# Patient Record
Sex: Female | Born: 1945 | Smoking: Never smoker
Health system: Southern US, Community
[De-identification: ages and names within clinical notes are randomized; demographics above are authoritative.]

## PROBLEM LIST (undated history)

## (undated) DIAGNOSIS — I639 Cerebral infarction, unspecified: Secondary | ICD-10-CM

## (undated) DIAGNOSIS — I10 Essential (primary) hypertension: Secondary | ICD-10-CM

## (undated) DIAGNOSIS — E785 Hyperlipidemia, unspecified: Secondary | ICD-10-CM

## (undated) HISTORY — PX: PARTIAL HYSTERECTOMY: SHX80

## (undated) HISTORY — DX: Cerebral infarction, unspecified: I63.9

## (undated) HISTORY — PX: WISDOM TOOTH EXTRACTION: SHX21

## (undated) HISTORY — DX: Essential (primary) hypertension: I10

## (undated) HISTORY — PX: TONSILLECTOMY: SUR1361

## (undated) HISTORY — DX: Hyperlipidemia, unspecified: E78.5

---

## 2015-12-03 ENCOUNTER — Encounter: Payer: Self-pay | Admitting: Hematology and Oncology

## 2015-12-03 ENCOUNTER — Telehealth: Payer: Self-pay | Admitting: Hematology and Oncology

## 2015-12-03 NOTE — Telephone Encounter (Signed)
Pt cld back to schedule appt with [facility] for 9/19 at 1pm. She originally planned to have her granddaughter bring her but due to her granddaughter's work schedule she was unable to have her bring to appt. She states that she will get transportation via cab services. Demographics have been verified. Letter mailed to the patient and faxed to the referring.

## 2015-12-20 ENCOUNTER — Telehealth: Payer: Self-pay | Admitting: *Deleted

## 2015-12-20 NOTE — Telephone Encounter (Signed)
Call received requesting [place] address for this new patient.  Address, valet parking and registration information provided with this call.

## 2015-12-24 ENCOUNTER — Encounter: Payer: Self-pay | Admitting: Hematology and Oncology

## 2015-12-24 ENCOUNTER — Ambulatory Visit (HOSPITAL_BASED_OUTPATIENT_CLINIC_OR_DEPARTMENT_OTHER): Payer: Medicare Other | Admitting: Hematology and Oncology

## 2015-12-24 DIAGNOSIS — D472 Monoclonal gammopathy: Secondary | ICD-10-CM | POA: Insufficient documentation

## 2015-12-24 NOTE — Progress Notes (Signed)
[facility] CONSULT NOTE  No care team member to display  CHIEF COMPLAINTS/PURPOSE OF CONSULTATION:  MGUS IgM lambda  HISTORY OF PRESENTING ILLNESS:  Amber Rich 70 y.o. female is here because of recent diagnosis of IgM lambda monoclonal protein. Patient is visiting from [place] and is evaluating whether she will live in [place]. Her family. She went on undergone evaluation with a primary care physician who referred her to orthopedics because of bilateral arthritis. Orthopedic surgeon obtain workup which showed positive for factor and serum protein for phrases was done which revealed elevation of monoclonal protein 0.2 g. On immunofixation came back as IgM lambda. She was referred was for further evaluation. She reports no neuropathy no bone pain.  MEDICAL HISTORY:  Anxiety, urinary incontinence, rheumatoid arthritis  SURGICAL HISTORY: C-section, hysterectomy, tonsillectomy  SOCIAL HISTORY: denies any tobacco. Drinks alcohol occasionally on holidays, denies recreational drug use, unemployed  FAMILY HISTORY: positive for arthritis, hypertension, diabetes and stroke  ALLERGIES:  has no allergies on file.  MEDICATIONS: buspirone, oxybutynin, naproxen  REVIEW OF SYSTEMS:   Constitutional: Denies fevers, chills or abnormal night sweats Eyes: Denies blurriness of vision, double vision or watery eyes Ears, nose, mouth, throat, and face: Denies mucositis or sore throat Respiratory: Denies cough, dyspnea or wheezes Cardiovascular: Denies palpitation, chest discomfort or lower extremity swelling Gastrointestinal:  Denies nausea, heartburn or change in bowel habits Skin: Denies abnormal skin rashes Lymphatics: Denies new lymphadenopathy or easy bruising Neurological:Denies numbness, tingling or new weaknesses Behavioral/Psych: Mood is stable, no new changes   All other systems were reviewed with the patient and are negative.  PHYSICAL EXAMINATION: ECOG  PERFORMANCE STATUS: 1 - Symptomatic but completely ambulatory  Vitals:   12/24/15 1230  BP: (!) 142/76  Pulse: 95  Resp: 19  Temp: 98.8 F (37.1 C)   Filed Weights   12/24/15 1230  Weight: 189 lb 11.2 oz (86 kg)    GENERAL:alert, no distress and comfortable SKIN: skin color, texture, turgor are normal, no rashes or significant lesions EYES: normal, conjunctiva are pink and non-injected, sclera clear OROPHARYNX:no exudate, no erythema and lips, buccal mucosa, and tongue normal  NECK: supple, thyroid normal size, non-tender, without nodularity LYMPH:  no palpable lymphadenopathy in the cervical, axillary or inguinal LUNGS: clear to auscultation and percussion with normal breathing effort HEART: regular rate & rhythm and no murmurs and no lower extremity edema ABDOMEN:abdomen soft, non-tender and normal bowel sounds Musculoskeletal:no cyanosis of digits and no clubbing  PSYCH: alert & oriented x 3 with fluent speech NEURO: no focal motor/sensory deficits  ASSESSMENT AND PLAN:  MGUS (monoclonal gammopathy of unknown significance) IgM MGUS - IgM MGUS accounts for approximately 15 percent of MGUS cases. It is considered separately from the non-IgM MGUS because it has the potential to progress to smoldering Waldenstrm macroglobulinemia and to symptomatic [place] macroglobulinemia and less often to lymphoma or AL amyloidosis. Infrequently, IgM MGUS can progress to IgM multiple myeloma.  Patient's blood work revealed IgM lambda: 0.2 g monoclonal protein August 2017 (workup was done by orthopedics as part of workup for rheumatoid arthritis)  No evidence of hypercalcemia or anemia or bone pain or renal dysfunction. Because of very low likelihood of myeloma, I did not think that patient needed bone survey.  Risk of transformation to Waldenstrm's macroglobulinemia or myeloma is about 1-2% per year. I recommended a follow-up in 1 year with recheck her blood work. Patient reports that  she may be going back to [facility]  where she is from. She may follow with the local hematologist near her home. However if she is still in [place] she will come back and see [place] in a year.   All questions were answered. The patient knows to call the clinic with any problems, questions or concerns.    Rulon Eisenmenger, MD 12/24/15

## 2015-12-24 NOTE — Assessment & Plan Note (Signed)
IgM MGUS - IgM MGUS accounts for approximately 15 percent of MGUS cases. It is considered separately from the non-IgM MGUS because it has the potential to progress to smoldering Walden[place]rm macroglobulinemia and to symptomatic Walden[place]rm macroglobulinemia and less often to lymphoma or AL amyloidosis. Infrequently, IgM MGUS can progress to IgM multiple myeloma.  Patient's blood work revealed IgM lambda: 0.2 g monoclonal protein August 2017 (workup was done by orthopedics as part of workup for rheumatoid arthritis)  No evidence of hypercalcemia or anemia or bone pain or renal dysfunction. Because of very low likelihood of myeloma, I did not think that patient needed bone survey.  Risk of transformation to Waldenstrm's macroglobulinemia or myeloma is about 1-2% per year. I recommended a follow-up in 1 year with recheck her blood work. Patient reports that she may be going back to [place] where she is from. She may follow with the local hematologist near her home. However if she is still in [place] she will come back and see [place] in a year.

## 2016-11-10 ENCOUNTER — Telehealth: Payer: Self-pay

## 2016-11-10 NOTE — Telephone Encounter (Signed)
Called and left a message with a new appt due to bmdc  Lanijah Warzecha 

## 2016-12-16 ENCOUNTER — Other Ambulatory Visit: Payer: Medicare Other

## 2016-12-22 ENCOUNTER — Ambulatory Visit: Payer: Medicare Other | Admitting: Hematology and Oncology

## 2016-12-22 NOTE — Assessment & Plan Note (Deleted)
IgM MGUS - IgM MGUS accounts for approximately 15 percent of MGUS cases. It is considered separately from the non-IgM MGUS because it has the potential to progress to smoldering Waldenstrm macroglobulinemia and to symptomatic Walden[place]rm macroglobulinemia and less often to lymphoma or AL amyloidosis. Infrequently, IgM MGUS can progress to IgM multiple myeloma.  Patient's blood work revealed IgM lambda: 0.2 g monoclonal protein August 2017 (workup was done by orthopedics as part of workup for rheumatoid arthritis)  No evidence of hypercalcemia or anemia or bone pain or renal dysfunction. Because of very low likelihood of myeloma, I did not think that patient needed bone survey.  Risk of transformation to Waldenstrm's macroglobulinemia or myeloma is about 1-2% per year. I recommended a follow-up in 1 year with recheck her blood work. Patient reports that she may be going back to [place] where she is from

## 2016-12-23 ENCOUNTER — Ambulatory Visit: Payer: Medicare Other | Admitting: Hematology and Oncology

## 2019-08-26 ENCOUNTER — Emergency Department (HOSPITAL_COMMUNITY)
Admission: EM | Admit: 2019-08-26 | Discharge: 2019-08-26 | Disposition: A | Discharge status: Home / Self Care | Payer: Medicare Other | Attending: Emergency Medicine | Admitting: Emergency Medicine

## 2019-08-26 ENCOUNTER — Emergency Department (HOSPITAL_COMMUNITY): Payer: Medicare Other

## 2019-08-26 ENCOUNTER — Encounter (HOSPITAL_COMMUNITY): Payer: Self-pay

## 2019-08-26 ENCOUNTER — Other Ambulatory Visit: Payer: Self-pay

## 2019-08-26 DIAGNOSIS — R300 Dysuria: Secondary | ICD-10-CM | POA: Diagnosis present

## 2019-08-26 DIAGNOSIS — N3090 Cystitis, unspecified without hematuria: Secondary | ICD-10-CM | POA: Insufficient documentation

## 2019-08-26 DIAGNOSIS — R42 Dizziness and giddiness: Secondary | ICD-10-CM | POA: Insufficient documentation

## 2019-08-26 LAB — BASIC METABOLIC PANEL
Anion gap: 13 (ref 5–15)
BUN: 19 mg/dL (ref 8–23)
CO2: 22 mmol/L (ref 22–32)
Calcium: 9.3 mg/dL (ref 8.9–10.3)
Chloride: 107 mmol/L (ref 98–111)
Creatinine, Ser: 0.88 mg/dL (ref 0.44–1.00)
GFR calc Af Amer: >60 mL/min (ref >60)
GFR calc non Af Amer: >60 mL/min (ref >60)
Glucose, Bld: 106 mg/dL — ABNORMAL HIGH (ref 70–99)
Potassium: 4.4 mmol/L (ref 3.5–5.1)
Sodium: 142 mmol/L (ref 135–145)

## 2019-08-26 LAB — URINALYSIS, ROUTINE W REFLEX MICROSCOPIC
Bilirubin Urine: NEGATIVE
Glucose, UA: NEGATIVE mg/dL
Hgb urine dipstick: NEGATIVE
Ketones, ur: NEGATIVE mg/dL
Nitrite: NEGATIVE
Protein, ur: NEGATIVE mg/dL
Specific Gravity, Urine: 1.017 (ref 1.005–1.030)
pH: 5 (ref 5.0–8.0)

## 2019-08-26 LAB — CBC WITH DIFFERENTIAL/PLATELET
Abs Immature Granulocytes: 0.03 10*3/uL (ref 0.00–0.07)
Basophils Absolute: 0 10*3/uL (ref 0.0–0.1)
Basophils Relative: 0 %
Eosinophils Absolute: 0.1 10*3/uL (ref 0.0–0.5)
Eosinophils Relative: 2 %
HCT: 42.3 % (ref 36.0–46.0)
Hemoglobin: 13.7 g/dL (ref 12.0–15.0)
Immature Granulocytes: 0 %
Lymphocytes Relative: 17 %
Lymphs Abs: 1.3 10*3/uL (ref 0.7–4.0)
MCH: 29.7 pg (ref 26.0–34.0)
MCHC: 32.4 g/dL (ref 30.0–36.0)
MCV: 91.8 fL (ref 80.0–100.0)
Monocytes Absolute: 0.6 10*3/uL (ref 0.1–1.0)
Monocytes Relative: 8 %
Neutro Abs: 5.4 10*3/uL (ref 1.7–7.7)
Neutrophils Relative %: 73 %
Platelets: 178 10*3/uL (ref 150–400)
RBC: 4.61 MIL/uL (ref 3.87–5.11)
RDW: 11.9 % (ref 11.5–15.5)
WBC: 7.5 10*3/uL (ref 4.0–10.5)
nRBC: 0 % (ref 0.0–0.2)

## 2019-08-26 LAB — HEPATIC FUNCTION PANEL
ALT: 20 U/L (ref 0–44)
AST: 19 U/L (ref 15–41)
Albumin: 4.2 g/dL (ref 3.5–5.0)
Alkaline Phosphatase: 67 U/L (ref 38–126)
Bilirubin, Direct: 0.1 mg/dL (ref 0.0–0.2)
Indirect Bilirubin: 0.9 mg/dL (ref 0.3–0.9)
Total Bilirubin: 1 mg/dL (ref 0.3–1.2)
Total Protein: 7.1 g/dL (ref 6.5–8.1)

## 2019-08-26 LAB — LIPASE, BLOOD: Lipase: 32 U/L (ref 11–51)

## 2019-08-26 MED ORDER — CEPHALEXIN 500 MG PO CAPS
500.0000 mg | ORAL_CAPSULE | Freq: Two times a day (BID) | ORAL | 0 refills | Status: AC
Start: 2019-08-26 — End: 2019-08-31

## 2019-08-26 NOTE — ED Triage Notes (Signed)
Pt c/o dizziness and possible UTI; pt endorses urinary frequency, dysuria, and decreased urine output; pt also c/o bilateral ankle swelling; denies CP, but states she sometimes feels her heart fluttering; occasional SOB; denies N/V

## 2019-08-26 NOTE — ED Provider Notes (Signed)
[facility] EMERGENCY DEPARTMENT Provider Note   CSN: [id number] Arrival date & time: 08/26/19  1506     History Chief Complaint  Patient presents with  . Dizziness  . Dysuria    Amber Rich is a 74 y.o. female.  Patient here with multiple issues.  Has had some intermittent dizziness for several weeks.  None currently at rest.  Sometimes she feels difficulty with ambulation.  Denies any extremity pain.  No headaches.  No vision changes.  Does notice a buzzing in her left ear at times.  Patient also concern for urinary tract infection as she does have dysuria.  She is also had some chronic right-sided rib pain that comes and goes over time as well.  She denies any numbness, weakness, speech changes.  No history of kidney stones.  The history is provided by the patient.  Illness Severity:  Mild Onset quality:  Gradual Timing:  Intermittent Progression:  Waxing and waning Chronicity:  New Associated symptoms: ear pain (buzzing)   Associated symptoms: no abdominal pain, no chest pain, no cough, no fever, no headaches, no loss of consciousness, no rash, no shortness of breath, no sore throat and no vomiting        History reviewed. No pertinent past medical history.  Patient Active Problem List   Diagnosis Date Noted  . MGUS (monoclonal gammopathy of unknown significance) 12/24/2015    History reviewed. No pertinent surgical history.   OB History   No obstetric history on file.     History reviewed. No pertinent family history.  Social History   Tobacco Use  . Smoking status: Never Smoker  . Smokeless tobacco: Never Used  Substance Use Topics  . Alcohol use: No  . Drug use: No    Home Medications Prior to Admission medications   Not on File    Allergies    Patient has no allergy information on record.  Review of Systems   Review of Systems  Constitutional: Negative for chills and fever.  HENT: Positive for ear pain (buzzing).  Negative for sore throat.   Eyes: Negative for pain and visual disturbance.  Respiratory: Negative for cough and shortness of breath.   Cardiovascular: Negative for chest pain and palpitations.  Gastrointestinal: Negative for abdominal pain and vomiting.  Genitourinary: Positive for dysuria. Negative for hematuria.  Musculoskeletal: Negative for arthralgias and back pain.  Skin: Negative for color change and rash.  Neurological: Positive for dizziness and light-headedness. Negative for tremors, seizures, loss of consciousness, syncope, facial asymmetry, speech difficulty, weakness, numbness and headaches.  All other systems reviewed and are negative.   Physical Exam Updated Vital Signs  ED Triage Vitals  Enc Vitals Group     BP 08/26/19 1510 (!) 162/80     Pulse Rate 08/26/19 1510 79     Resp 08/26/19 1510 17     Temp 08/26/19 1510 98.6 F (37 C)     Temp Source 08/26/19 1510 Oral     SpO2 08/26/19 1510 100 %     Weight 08/26/19 1510 200 lb (90.7 kg)     Height 08/26/19 1510 5' 9.5" (1.765 m)     Head Circumference --      Peak Flow --      Pain Score 08/26/19 1532 0     Pain Loc --      Pain Edu? --      Excl. in [place]? --     Physical Exam Vitals and nursing note reviewed.  Constitutional:      General: She is not in acute distress.    Appearance: She is well-developed.  HENT:     Head: Normocephalic and atraumatic.     Right Ear: Tympanic membrane normal.     Ears:     Comments: Earwax in left ear canal    Nose: Nose normal.     Mouth/Throat:     Mouth: Mucous membranes are moist.  Eyes:     Extraocular Movements: Extraocular movements intact.     Conjunctiva/sclera: Conjunctivae normal.     Pupils: Pupils are equal, round, and reactive to light.  Cardiovascular:     Rate and Rhythm: Normal rate and regular rhythm.     Pulses: Normal pulses.     Heart sounds: Normal heart sounds. No murmur.  Pulmonary:     Effort: Pulmonary effort is normal. No respiratory  distress.     Breath sounds: Normal breath sounds.  Abdominal:     Palpations: Abdomen is soft.     Tenderness: There is no abdominal tenderness.  Musculoskeletal:        General: No tenderness. Normal range of motion.     Cervical back: Normal range of motion and neck supple.  Skin:    General: Skin is warm and dry.     Capillary Refill: Capillary refill takes less than 2 seconds.  Neurological:     General: No focal deficit present.     Mental Status: She is alert and oriented to person, place, and time.     Cranial Nerves: No cranial nerve deficit.     Sensory: No sensory deficit.     Motor: No weakness.     Coordination: Coordination normal.     Gait: Gait normal.     Comments: 5+ out of 5 strength throughout, normal sensation, no drift, normal finger-to-nose finger, normal speech     ED Results / Procedures / Treatments   Labs (all labs ordered are listed, but only abnormal results are displayed) Labs Reviewed  BASIC METABOLIC PANEL - Abnormal; Notable for the following components:      Result Value   Glucose, Bld 106 (*)    All other components within normal limits  URINALYSIS, ROUTINE W REFLEX MICROSCOPIC - Abnormal; Notable for the following components:   Leukocytes,Ua TRACE (*)    Bacteria, UA RARE (*)    All other components within normal limits  URINE CULTURE  CBC WITH DIFFERENTIAL/PLATELET  HEPATIC FUNCTION PANEL  LIPASE, BLOOD    EKG EKG Interpretation  Date/Time:  Saturday Aug 26 2019 15:41:29 EDT Ventricular Rate:  80 PR Interval:    QRS Duration: 108 QT Interval:  394 QTC Calculation: 455 R Axis:   1 Text Interpretation: Sinus rhythm Low voltage, precordial leads Abnormal R-wave progression, early transition Minimal ST depression, lateral leads Confirmed by Lennice Sites [phone]) on 08/26/2019 3:55:27 PM   Radiology DG Chest 2 View  Result Date: 08/26/2019 CLINICAL DATA:  Right-sided rib pain. EXAM: CHEST - 2 VIEW COMPARISON:  None. FINDINGS:  The heart size and mediastinal contours are within normal limits. Both lungs are clear. The visualized skeletal structures are unremarkable. IMPRESSION: No active cardiopulmonary disease. Electronically Signed   By: Virgina Norfolk M.D.   On: 08/26/2019 16:06   CT Head Wo Contrast  Result Date: 08/26/2019 CLINICAL DATA:  Dizziness. EXAM: CT HEAD WITHOUT CONTRAST TECHNIQUE: Contiguous axial images were obtained from the base of the skull through the vertex without intravenous contrast. COMPARISON:  None. FINDINGS:  Brain: No evidence of acute infarction, hemorrhage, hydrocephalus, extra-axial collection or mass lesion/mass effect. Vascular: No hyperdense vessel or unexpected calcification. Skull: Normal. Negative for fracture or focal lesion. Sinuses/Orbits: No acute finding. Other: None. IMPRESSION: No acute intracranial pathology. Electronically Signed   By: Virgina Norfolk M.D.   On: 08/26/2019 16:11    Procedures Procedures (including critical care time)  Medications Ordered in ED Medications - No data to display  ED Course  I have reviewed the triage vital signs and the nursing notes.  Pertinent labs & imaging results that were available during my care of the patient were reviewed by me and considered in my medical decision making (see chart for details).    MDM Rules/Calculators/A&P                      Alivya Spearman is a 74 year old female with history of MGUS who presents to the ED with multiple complaints.  Patient with overall unremarkable vitals.  Patient mostly concerned about pain with urination.  Concern for UTI.  She is also had some chronic dizziness on and off for the last several weeks.  She has noticed a buzzing in her left ear.  No other stroke symptoms.  Normal neurological exam.  Has some earwax in the left ear.  Denies any current chest pain or shortness of breath.  No abdominal pain.  Overall exam is unremarkable.  Given her multiple symptoms will get lab work  including chest x-ray, head CT, EKG, urine studies.  EKG shows sinus rhythm.  No ischemic changes.  Chest x-ray without any signs of infection.  No rib fractures.  Head CT is normal.  No significant anemia, ocular abnormality, kidney injury.  Urinalysis likely with infection.  Given her symptoms we will treat with Keflex.  Urine culture to be sent.  Patient given information to follow-up with ENT given left ear buzzing and dizziness intermittently.  Possibly inner ear issue.  Educated her about possibly cleaning her ears out but to avoid using Q-tips.  Given return precautions and discharged in ED in good condition.  This chart was dictated using voice recognition software.  Despite best efforts to proofread,  errors can occur which can change the documentation meaning.    Final Clinical Impression(s) / ED Diagnoses Final diagnoses:  Dysuria  Cystitis    Rx / DC Orders ED Discharge Orders    None       Lennice Sites, DO 08/26/19 1651

## 2019-08-26 NOTE — ED Notes (Signed)
Patient verbalizes understanding of discharge instructions. Opportunity for questioning and answers were provided. Pt discharged from ED. 

## 2019-08-26 NOTE — ED Notes (Signed)
Pt transported to XR and CT

## 2019-08-28 LAB — URINE CULTURE: Culture: 60000 — AB

## 2019-08-29 ENCOUNTER — Telehealth: Payer: Self-pay | Admitting: *Deleted

## 2019-08-29 NOTE — Telephone Encounter (Signed)
Post ED Visit - Positive Culture Follow-up  Culture report reviewed by antimicrobial stewardship pharmacist: [facility] Team []  Elenor Quinones, Pharm.D. []  Heide Guile, Pharm.D., BCPS AQ-ID []  Parks Neptune, Pharm.D., BCPS []  Alycia Rossetti, Pharm.D., BCPS []  [place], Pharm.D., BCPS, AAHIVP []  Legrand Como, Pharm.D., BCPS, AAHIVP []  Salome Arnt, PharmD, BCPS []  Johnnette Gourd, PharmD, BCPS []  Hughes Better, PharmD, BCPS []  Leeroy Cha, PharmD []  Laqueta Linden, PharmD, BCPS []  Albertina Parr, PharmD Acey Lav, [place] Team []  Leodis Sias, PharmD []  Lindell Spar, PharmD []  Royetta Asal, PharmD []  Graylin Shiver, Rph []  Rema Fendt) Glennon Mac, PharmD []  Arlyn Dunning, PharmD []  Netta Cedars, PharmD []  Dia Sitter, PharmD []  Leone Haven, PharmD []  Gretta Arab, PharmD []  Theodis Shove, PharmD []  Peggyann Juba, PharmD []  Reuel Boom, PharmD   Positive urine culture Treated with Cephalexin, organism sensitive to the same and no further patient follow-up is required at this time.  Harlon Flor [facility] 08/29/2019, 2:18 PM

## 2019-12-01 ENCOUNTER — Emergency Department (HOSPITAL_COMMUNITY)
Admission: EM | Admit: 2019-12-01 | Discharge: 2019-12-02 | Disposition: A | Discharge status: Home / Self Care | Payer: Medicare Other | Attending: Emergency Medicine | Admitting: Emergency Medicine

## 2019-12-01 ENCOUNTER — Other Ambulatory Visit: Payer: Self-pay

## 2019-12-01 ENCOUNTER — Encounter (HOSPITAL_COMMUNITY): Payer: Self-pay

## 2019-12-01 DIAGNOSIS — R42 Dizziness and giddiness: Secondary | ICD-10-CM | POA: Insufficient documentation

## 2019-12-01 DIAGNOSIS — Z8673 Personal history of transient ischemic attack (TIA), and cerebral infarction without residual deficits: Secondary | ICD-10-CM

## 2019-12-01 DIAGNOSIS — H9312 Tinnitus, left ear: Secondary | ICD-10-CM | POA: Insufficient documentation

## 2019-12-01 DIAGNOSIS — M7918 Myalgia, other site: Secondary | ICD-10-CM | POA: Insufficient documentation

## 2019-12-01 DIAGNOSIS — I10 Essential (primary) hypertension: Secondary | ICD-10-CM | POA: Insufficient documentation

## 2019-12-01 DIAGNOSIS — G44209 Tension-type headache, unspecified, not intractable: Secondary | ICD-10-CM | POA: Diagnosis not present

## 2019-12-01 MED ORDER — SODIUM CHLORIDE 0.9 % IV BOLUS
500.0000 mL | Freq: Once | INTRAVENOUS | Status: AC
  Administered 2019-12-02: 500 mL via INTRAVENOUS

## 2019-12-01 MED ORDER — ACETAMINOPHEN 500 MG PO TABS
1000.0000 mg | ORAL_TABLET | Freq: Once | ORAL | Status: AC
  Administered 2019-12-02: 1000 mg via ORAL
  Filled 2019-12-01: qty 2

## 2019-12-01 NOTE — ED Triage Notes (Signed)
Pt presents with a headache, tremors and buzzing in the Left side of her head for "months and months"

## 2019-12-02 ENCOUNTER — Emergency Department (HOSPITAL_COMMUNITY): Payer: Medicare Other

## 2019-12-02 LAB — I-STAT CHEM 8, ED
BUN: 23 mg/dL (ref 8–23)
Calcium, Ion: 1.21 mmol/L (ref 1.15–1.40)
Chloride: 106 mmol/L (ref 98–111)
Creatinine, Ser: 0.8 mg/dL (ref 0.44–1.00)
Glucose, Bld: 93 mg/dL (ref 70–99)
HCT: 43 % (ref 36.0–46.0)
Hemoglobin: 14.6 g/dL (ref 12.0–15.0)
Potassium: 4 mmol/L (ref 3.5–5.1)
Sodium: 141 mmol/L (ref 135–145)
TCO2: 26 mmol/L (ref 22–32)

## 2019-12-02 MED ORDER — IOHEXOL 350 MG/ML SOLN
100.0000 mL | Freq: Once | INTRAVENOUS | Status: AC | PRN
  Administered 2019-12-02: 100 mL via INTRAVENOUS

## 2019-12-02 NOTE — ED Notes (Signed)
Pt transported to CT ?

## 2019-12-02 NOTE — ED Notes (Signed)
Patient verbalizes understanding of discharge instructions. Opportunity for questioning and answers were provided. Armband removed by staff, pt discharged from ED ambulatory to home.  

## 2019-12-02 NOTE — ED Provider Notes (Signed)
[facility] EMERGENC[facility] DEPARTMENT Provider Note  CSN: [id number] Arrival date & time: 12/01/19 1030  Chief Complaint(s) Headache  HPI Amber Rich is a 74 y.o. female with a history of hypertension, hyperlipidemia who presents to the emergency department with approximately 8 or 9 months of buzzing in her ears.  She reports that it was initially bilateral but now it has only on the left.  She reports that it has been constant since onset.  At times it fluctuates in intensity.  Reports that she notices it more at night or when it is quiet around.  It does not sound pulsatile.  Over the past several weeks she has had intermittent left occipital headache that is worse with palpation of her neck.  No change in vision.  She does report some dizziness which has been going on for several months.  No focal deficits.  No recent fevers or infections.  No chest pain or shortness of breath.  No trauma.  No nausea vomiting.  No abdominal pain.  No other physical complaints.  Patient has discussed this with her primary care provider who recommended she be evaluated by ENT.  HPI  Past Medical History History reviewed. No pertinent past medical history. Patient Active Problem List   Diagnosis Date Noted  . 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 No pertinent surgical history. Family History No family history on file.  Social History Social History   Tobacco Use  . Smoking status: Never Smoker  . Smokeless tobacco: Never Used  Substance Use Topics  . Alcohol use: No  . Drug use: No   Allergies Patient has no known allergies.  Review of Systems Review of Systems All other systems are reviewed and are negative for  acute change except as noted in the HPI  Physical Exam Vital Signs  I have reviewed the triage vital signs BP (!) 158/92 (BP Location: Right Arm)   Pulse 75   Temp 98.2 F (36.8 C) (Oral)   Resp 16   SpO2 100%  She Physical Exam Vitals reviewed.  Constitutional:      General: She is not in acute distress.    Appearance: She is well-developed. She is not diaphoretic.  HENT:     Head: Normocephalic and atraumatic.     Nose: Nose normal.  Eyes:     General: No scleral icterus.       Right eye: No discharge.        Left eye: No discharge.     Conjunctiva/sclera: Conjunctivae normal.     Pupils: Pupils are equal, round, and reactive to light.  Neck:  Cardiovascular:     Rate and Rhythm: Normal rate and regular rhythm.     Heart sounds: No murmur heard.  No friction rub. No gallop.   Pulmonary:     Effort: Pulmonary effort is normal. No respiratory distress.     Breath sounds: Normal breath sounds. No stridor. No rales.  Abdominal:     General: There is no distension.     Palpations: Abdomen is soft.     Tenderness: There is no abdominal tenderness.  Musculoskeletal:        General: No tenderness.     Cervical back: Normal range of motion and neck supple. Muscular tenderness (this reproduces her headache) present.  Skin:    General: Skin is warm and dry.     Findings: No erythema or rash.  Neurological:     Mental Status: She is alert and oriented to person, place, and time.     Comments: Mental Status:  Alert and oriented to person, place, and time.  Attention and concentration normal.  Speech clear.  Recent memory is intact  Cranial Nerves:  II Visual Fields: Intact to confrontation. Visual fields intact. III, IV, VI: Pupils equal and reactive to light and near. Full eye movement with bidirectional nonsustained nystagmus  V Facial Sensation: Normal. No weakness of masticatory muscles  VII: No facial weakness or asymmetry  VIII Auditory Acuity: Grossly normal    IX/X: The uvula is midline; the palate elevates symmetrically  XI: Normal sternocleidomastoid and trapezius strength  XII: The tongue is midline. No atrophy or fasciculations.   Motor System: Muscle Strength: 5/5 and symmetric in the upper and lower extremities. No pronation or drift.  Muscle Tone: Tone and muscle bulk are normal in the upper and lower extremities.   Reflexes: DTRs: 1+ and symmetrical in all four extremities. No Clonus Coordination: Intact finger-to-nose.. No tremor.  Sensation: Intact to light touch.  Gait: Routine gait normal.      ED Results and Treatments Labs (all labs ordered are listed, but only abnormal results are displayed) Labs Reviewed  I-STAT CHEM 8, ED                                                                                                                         EKG  EKG Interpretation  Date/Time:    Ventricular Rate:    PR Interval:    QRS Duration:   QT Interval:    QTC Calculation:   R Axis:     Text Interpretation:        Radiology CT Angio Head W or Wo Contrast  Result Date: 12/02/2019 CLINICAL DATA:  Initial evaluation for subtle month history of headache. EXAM: CT ANGIOGRAPHY HEAD AND NECK TECHNIQUE: Multidetector CT imaging of the head and neck was performed using the standard protocol during bolus administration of intravenous contrast. Multiplanar CT image reconstructions and MIPs were obtained to evaluate the vascular anatomy. Carotid stenosis measurements (when applicable) are obtained utilizing NASCET criteria, using the  distal internal carotid diameter as the denominator. CONTRAST:  18mL OMNIPAQUE IOHEXOL 350 MG/ML SOLN COMPARISON:  Prior CT from 08/26/2019. FINDINGS: CT HEAD FINDINGS Brain: Cerebral volume within normal limits for age. Few scattered remote infarcts noted involving the cerebellum bilaterally. No acute intracranial hemorrhage. No acute large vessel territory infarct. No mass lesion or midline shift. No  hydrocephalus or extra-axial fluid collection. Vascular: No hyperdense vessel. Scattered vascular calcifications noted within the carotid siphons. Skull: Scalp soft tissues and calvarium within normal limits. Sinuses: Mild scattered mucoperiosteal thickening noted within the maxillary sinuses. Paranasal sinuses are otherwise largely clear. No mastoid effusion. Orbits: Globes and orbital soft tissues within normal limits. Review of the MIP images confirms the above findings CTA NECK FINDINGS Aortic arch: Visualized aortic arch of normal caliber with normal 3 vessel morphology. No hemodynamically significant stenosis seen about the origin of the great vessels. Right carotid system: Right common and internal carotid arteries widely patent without stenosis, dissection or occlusion. Left carotid system: Left common and internal carotid arteries widely patent without stenosis, dissection or occlusion. Vertebral arteries: Both vertebral arteries arise from the subclavian arteries. Vertebral arteries widely patent without stenosis, dissection or occlusion. Skeleton: No visible acute osseous abnormality. No discrete or worrisome osseous lesions. Other neck: No other acute soft tissue abnormality within the neck. No mass lesion or adenopathy. Upper chest: Visualized upper chest demonstrates no acute finding. Review of the MIP images confirms the above findings CTA HEAD FINDINGS Anterior circulation: Both internal carotid arteries widely patent to the termini without stenosis or other abnormality. A1 segments patent bilaterally. Normal anterior communicating artery complex. Anterior cerebral arteries widely patent to their distal aspects without stenosis. No M1 stenosis or occlusion. Normal MCA bifurcations. Distal MCA branches well perfused and symmetric. Posterior circulation: Vertebral arteries patent to the vertebrobasilar junction without stenosis. Left vertebral artery dominant. Both picas patent. Basilar widely patent  to its distal aspect without stenosis. Superior cerebral arteries patent bilaterally. PCA supplied via the basilar as well as small bilateral posterior communicating arteries. Both PCAs well perfused or distal aspects without stenosis. Venous sinuses: Grossly patent allowing for timing of the contrast bolus. Anatomic variants: None significant.  No intracranial aneurysm. Review of the MIP images confirms the above findings IMPRESSION: CT HEAD IMPRESSION: 1. No acute intracranial abnormality. 2. Few scattered small remote bilateral cerebellar infarcts. CTA HEAD AND NECK IMPRESSION: Negative CTA of the head and neck. No large vessel occlusion, hemodynamically significant stenosis or other acute vascular abnormality. No aneurysm. Electronically Signed   By: Jeannine Boga M.D.   On: 12/02/2019 02:01   CT Angio Neck W and/or Wo Contrast  Result Date: 12/02/2019 CLINICAL DATA:  Initial evaluation for subtle month history of headache. EXAM: CT ANGIOGRAPHY HEAD AND NECK TECHNIQUE: Multidetector CT imaging of the head and neck was performed using the standard protocol during bolus administration of intravenous contrast. Multiplanar CT image reconstructions and MIPs were obtained to evaluate the vascular anatomy. Carotid stenosis measurements (when applicable) are obtained utilizing NASCET criteria, using the distal internal carotid diameter as the denominator. CONTRAST:  135mL OMNIPAQUE IOHEXOL 350 MG/ML SOLN COMPARISON:  Prior CT from 08/26/2019. FINDINGS: CT HEAD FINDINGS Brain: Cerebral volume within normal limits for age. Few scattered remote infarcts noted involving the cerebellum bilaterally. No acute intracranial hemorrhage. No acute large vessel territory infarct. No mass lesion or midline shift. No hydrocephalus or extra-axial fluid collection. Vascular: No hyperdense vessel. Scattered vascular calcifications noted within the carotid siphons. Skull: Scalp soft tissues and calvarium  within normal limits.  Sinuses: Mild scattered mucoperiosteal thickening noted within the maxillary sinuses. Paranasal sinuses are otherwise largely clear. No mastoid effusion. Orbits: Globes and orbital soft tissues within normal limits. Review of the MIP images confirms the above findings CTA NECK FINDINGS Aortic arch: Visualized aortic arch of normal caliber with normal 3 vessel morphology. No hemodynamically significant stenosis seen about the origin of the great vessels. Right carotid system: Right common and internal carotid arteries widely patent without stenosis, dissection or occlusion. Left carotid system: Left common and internal carotid arteries widely patent without stenosis, dissection or occlusion. Vertebral arteries: Both vertebral arteries arise from the subclavian arteries. Vertebral arteries widely patent without stenosis, dissection or occlusion. Skeleton: No visible acute osseous abnormality. No discrete or worrisome osseous lesions. Other neck: No other acute soft tissue abnormality within the neck. No mass lesion or adenopathy. Upper chest: Visualized upper chest demonstrates no acute finding. Review of the MIP images confirms the above findings CTA HEAD FINDINGS Anterior circulation: Both internal carotid arteries widely patent to the termini without stenosis or other abnormality. A1 segments patent bilaterally. Normal anterior communicating artery complex. Anterior cerebral arteries widely patent to their distal aspects without stenosis. No M1 stenosis or occlusion. Normal MCA bifurcations. Distal MCA branches well perfused and symmetric. Posterior circulation: Vertebral arteries patent to the vertebrobasilar junction without stenosis. Left vertebral artery dominant. Both picas patent. Basilar widely patent to its distal aspect without stenosis. Superior cerebral arteries patent bilaterally. PCA supplied via the basilar as well as small bilateral posterior communicating arteries. Both PCAs well perfused or distal  aspects without stenosis. Venous sinuses: Grossly patent allowing for timing of the contrast bolus. Anatomic variants: None significant.  No intracranial aneurysm. Review of the MIP images confirms the above findings IMPRESSION: CT HEAD IMPRESSION: 1. No acute intracranial abnormality. 2. Few scattered small remote bilateral cerebellar infarcts. CTA HEAD AND NECK IMPRESSION: Negative CTA of the head and neck. No large vessel occlusion, hemodynamically significant stenosis or other acute vascular abnormality. No aneurysm. Electronically Signed   By: Jeannine Boga M.D.   On: 12/02/2019 02:01    Pertinent labs & imaging results that were available during my care of the patient were reviewed by me and considered in my medical decision making (see chart for details).  Medications Ordered in ED Medications  acetaminophen (TYLENOL) tablet 1,000 mg (1,000 mg Oral Given 12/02/19 0019)  sodium chloride 0.9 % bolus 500 mL (0 mLs Intravenous Stopped 12/02/19 0141)  iohexol (OMNIPAQUE) 350 MG/ML injection 100 mL (100 mLs Intravenous Contrast Given 12/02/19 0113)                                                                                                                                    Procedures Procedures  (including critical care time)  Medical Decision Making / ED Course I have reviewed the nursing notes for this encounter and the patient's prior records (if available  in EHR or on provided paperwork).   Amber Rich was evaluated in Emergency Department on 12/02/2019 for the symptoms described in the history of present illness. She was evaluated in the context of the global COVID-19 pandemic, which necessitated consideration that the patient might be at risk for infection with the SARS-CoV-2 virus that causes COVID-19. Institutional protocols and algorithms that pertain to the evaluation of patients at risk for COVID-19 are in a state of rapid change based on information released by regulatory  bodies including the CDC and federal and state organizations. These policies and algorithms were followed during the patient's care in the ED.  Non focal neuro exam, though she has bidirectional nystagmus on his exam.  No recent head trauma. No fever. Doubt meningitis. Doubt intracranial bleed. Doubt IIH.   Given the patient's neck pain, would like to assess for vascular process.  Recommended MRI with MRA but patient refused MRI stating that she was too claustrophobic.  Obtain a CTA which did not show any evidence of vascular process but did reveal small bilateral remote cerebellar strokes.  Given that her symptoms have been ongoing for months, this is likely the etiology of her dizziness.  Recommend she follow-up with a neurologist. Recommended she follow-up with ENT for her tinnitus.      Final Clinical Impression(s) / ED Diagnoses Final diagnoses:  Tinnitus, left  Dizziness  History of cerebellar stroke  Muscle tension headache    The patient appears reasonably screened and/or stabilized for discharge and I doubt any other medical condition or other [facility] requiring further screening, evaluation, or treatment in the ED at this time prior to discharge. Safe for discharge with strict return precautions.  Disposition: Discharge  Condition: Good  I have discussed the results, Dx and Tx plan with the patient/family who expressed understanding and agree(s) with the plan. Discharge instructions discussed at length. The patient/family was given strict return precautions who verbalized understanding of the instructions. No further questions at time of discharge.    ED Discharge Orders    None         Follow Up: Joya Gaskins, FNP [address] [place] [address] [phone]  Schedule an appointment as soon as possible for a visit    [facility] [address]     [place] [phone] Schedule an  appointment as soon as possible for a visit  For close follow up to assess for old strokes     This chart was dictated using voice recognition software.  Despite best efforts to proofread,  errors can occur which can change the documentation meaning.   Fatima Blank, MD 12/02/19 [phone]

## 2020-02-21 ENCOUNTER — Ambulatory Visit: Payer: Medicare Other | Admitting: Cardiology

## 2020-02-21 ENCOUNTER — Ambulatory Visit: Payer: Medicare Other

## 2020-02-21 ENCOUNTER — Encounter: Payer: Self-pay | Admitting: Cardiology

## 2020-02-21 ENCOUNTER — Other Ambulatory Visit: Payer: Self-pay

## 2020-02-21 VITALS — BP 137/73 | HR 78 | Resp 16 | Ht 69.5 in | Wt 200.7 lb

## 2020-02-21 DIAGNOSIS — I10 Essential (primary) hypertension: Secondary | ICD-10-CM

## 2020-02-21 DIAGNOSIS — R002 Palpitations: Secondary | ICD-10-CM

## 2020-02-21 DIAGNOSIS — R42 Dizziness and giddiness: Secondary | ICD-10-CM

## 2020-02-21 DIAGNOSIS — Z8673 Personal history of transient ischemic attack (TIA), and cerebral infarction without residual deficits: Secondary | ICD-10-CM

## 2020-02-21 DIAGNOSIS — E7800 Pure hypercholesterolemia, unspecified: Secondary | ICD-10-CM

## 2020-02-21 NOTE — Progress Notes (Signed)
Date:  02/21/2020   ID:  Lyndon Code, DOB December 20, 1945, MRN [id number]  PCP:  Joya Gaskins, FNP  Cardiologist:  Rex Kras, DO, [facility] (established care 02/21/2020) Former Cardiology Providers: Dr. Linward Headland at [facility] phone # [phone]  REASON FOR CONSULT: Dizziness  REQUESTING PHYSICIAN:  Joya Gaskins, [address],  [place] [address]  Chief Complaint  Patient presents with  . Dizziness  . New Patient (Initial Visit)    Referred by Patrecia Pace FNP    HPI  Amber Rich is a 74 y.o. female who presents to the office with a chief complaint of "dizzy." Patient's past medical history and cardiovascular risk factors include: Hx for TIA, hyperlipidemia, hypertension, postmenopausal female, advanced age.  She is referred to the office at the request of Joya Gaskins, * for evaluation of dizziness and hyperlipidemia management.  Patient recently moved from [place] to [place] in February 2021.  She was asked by her primary care provider to establish care with cardiology as that she used to see a cardiologist back in [place].  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  Patient continues to be dizzy for the last several months.  The symptoms have been sporadically and last for short period of time.  They occur while she is sitting, ambulating, turning her head side to side.  The dizziness does not happen with changing positions.  She denies any falls or syncopal events.  Patient states that she supposed to follow-up with neurology but has not done so just yet.  Patient denies any chest pain or anginal equivalent.  She is overall euvolemic and denies heart failure symptoms.  I do not have any old records from her prior cardiologist to review at today's  office.  FUNCTIONAL STATUS: No structured exercise program or daily routine.   ALLERGIES: No Known Allergies  MEDICATION LIST PRIOR TO VISIT: Current Meds  Medication Sig  . amLODipine (NORVASC) 2.5 MG tablet Take 2.5 mg by mouth daily.  Marland Kitchen atorvastatin (LIPITOR) 40 MG tablet Take 40 mg by mouth daily.  . carvedilol (COREG) 6.25 MG tablet Take 6.25 mg by mouth 2 (two) times daily.  Marland Kitchen lisinopril (ZESTRIL) 10 MG tablet Take 10 mg by mouth daily.     PAST MEDICAL HISTORY: Past Medical History:  Diagnosis Date  . Hyperlipidemia   . Hypertension   . TIA     PAST SURGICAL HISTORY: Past Surgical History:  Procedure Laterality Date  . CESAREAN SECTION    . PARTIAL HYSTERECTOMY    . TONSILLECTOMY    . WISDOM TOOTH EXTRACTION      FAMILY HISTORY: The patient family history includes Brain cancer in her brother; Breast cancer in her mother; Cirrhosis in her father; HIV/AIDS in her sister.  SOCIAL HISTORY:  The patient  reports that she has never smoked. She has never used smokeless tobacco. She reports that she does not drink alcohol and does not use drugs.  REVIEW OF SYSTEMS: Review of Systems  Constitutional: Negative for chills and fever.  HENT: Negative for hoarse voice and nosebleeds.   Eyes: Negative for discharge, double vision and pain.  Cardiovascular: Negative for chest pain, claudication, dyspnea on exertion, leg swelling, near-syncope, orthopnea, palpitations, paroxysmal nocturnal dyspnea and syncope.  Respiratory: Positive for shortness of breath. Negative for hemoptysis.  Musculoskeletal: Negative for muscle cramps and myalgias.  Gastrointestinal: Negative for abdominal pain, constipation, diarrhea, hematemesis, hematochezia, melena, nausea and vomiting.  Neurological: Positive for dizziness and light-headedness.    PHYSICAL EXAM: Vitals with BMI 02/21/2020 12/02/2019 12/02/2019  Height 5' 9.5" - -  Weight 200 lbs 11 oz - -  BMI 24.09 - -  Systolic 735 329 924   Diastolic 73 72 92  Pulse 78 66 75   Orthostatic VS for the past 72 hrs (Last 3 readings):  Orthostatic BP Patient Position BP Location Cuff Size Orthostatic Pulse  02/21/20 0912 134/62 Standing Left Arm Large 76  02/21/20 0910 140/76 Sitting Left Arm Large 74  02/21/20 0909 156/80 Supine Left Arm Large 75   CONSTITUTIONAL: Well-developed and well-nourished. No acute distress.  SKIN: Skin is warm and dry. No rash noted. No cyanosis. No pallor. No jaundice HEAD: Normocephalic and atraumatic.  EYES: No scleral icterus MOUTH/THROAT: Moist oral membranes.  NECK: No JVD present. No thyromegaly noted. No carotid bruits  LYMPHATIC: No visible cervical adenopathy.  CHEST Normal respiratory effort. No intercostal retractions  LUNGS: Clear to auscultation bilaterally.  No stridor. No wheezes. No rales.  CARDIOVASCULAR: Regular rate and rhythm, positive S1-S2, no murmurs rubs or gallops appreciated. ABDOMINAL: Soft, nontender, nondistended, positive bowel sounds in all 4 quadrants, no apparent ascites.  EXTREMITIES: No peripheral edema  HEMATOLOGIC: No significant bruising NEUROLOGIC: Oriented to person, place, and time. Nonfocal. Normal muscle tone.  PSYCHIATRIC: Normal mood and affect. Normal behavior. Cooperative  CARDIAC DATABASE: EKG: 02/21/2020: Normal sinus rhythm, 73 bpm, poor R wave progression, without underlying injury pattern.  Echocardiogram: None   Stress Testing: None  Heart Catheterization: None  Carotid duplex: None  RADIOLOGY: 12/02/2019:  CT HEAD IMPRESSION:  1. No acute intracranial abnormality. 2. Few scattered small remote bilateral cerebellar infarcts.  CTA HEAD AND NECK IMPRESSION:  Negative CTA of the head and neck. No large vessel occlusion, hemodynamically significant stenosis or other acute vascular abnormality. No aneurysm.  LABORATORY DATA: CBC Latest Ref Rng & Units 12/02/2019 08/26/2019  WBC 4.0 - 10.5 K/uL - 7.5  Hemoglobin 12.0 - 15.0  g/dL 14.6 13.7  Hematocrit 36 - 46 % 43.0 42.3  Platelets 150 - 400 K/uL - 178    CMP Latest Ref Rng & Units 12/02/2019 08/26/2019  Glucose 70 - 99 mg/dL 93 106(H)  BUN 8 - 23 mg/dL 23 19  Creatinine 0.44 - 1.00 mg/dL 0.80 0.88  Sodium 135 - 145 mmol/L 141 142  Potassium 3.5 - 5.1 mmol/L 4.0 4.4  Chloride 98 - 111 mmol/L 106 107  CO2 22 - 32 mmol/L - 22  Calcium 8.9 - 10.3 mg/dL - 9.3  Total Protein 6.5 - 8.1 g/dL - 7.1  Total Bilirubin 0.3 - 1.2 mg/dL - 1.0  Alkaline Phos 38 - 126 U/L - 67  AST 15 - 41 U/L - 19  ALT 0 - 44 U/L - 20    Lipid Panel  No results found for: CHOL, TRIG, HDL, CHOLHDL, VLDL, LDLCALC, LDLDIRECT, LABVLDL  No components found for: NTPROBNP No results for input(s): PROBNP in the last 8760 hours. No results for input(s): TSH in the last 8760 hours.  BMP Recent Labs    08/26/19 1544 12/02/19 0032  NA 142 141  K 4.4 4.0  CL 107 106  CO2 22  --   GLUCOSE 106* 93  BUN 19 23  CREATININE 0.88 0.80  CALCIUM 9.3  --   GFRNONAA >60  --  GFRAA >60  --     HEMOGLOBIN A1C No results found for: HGBA1C, MPG  IMPRESSION:    ICD-10-CM   1. Dizziness  R42 EKG 12-Lead    LONG TERM MONITOR-LIVE TELEMETRY (3-14 DAYS)    PCV CAROTID DUPLEX (BILATERAL)  2. Hypercholesterolemia  E78.00 Lipid Panel With LDL/HDL Ratio    LDL cholesterol, direct    CMP14+EGFR  3. Palpitations  R00.2 LONG TERM MONITOR-LIVE TELEMETRY (3-14 DAYS)  4. Hx of TIA (transient ischemic attack) and stroke  Z86.73 LONG TERM MONITOR-LIVE TELEMETRY (3-14 DAYS)  5. Benign hypertension  I10 PCV ECHOCARDIOGRAM COMPLETE     RECOMMENDATIONS: Lisett Dirusso is a 74 y.o. female whose past medical history and cardiac risk factors include: Hx for TIA, hyperlipidemia, hypertension, postmenopausal female, advanced age.  Dizziness:  Given the work-up thus far I believe the dizziness is most likely secondary to her prior cerebellar infarct noted on CT scan in August 2021.  I highly encouraged that  she follows up with neurology for any additional work-up that may be needed.  Orthostatic vital signs negative.  Her prior cerebellar infarcts are mostly secondary to posterior circulation but would still recommend carotid duplex to rule out carotid artery atherosclerosis that she has underlying risk factors such as hyperlipidemia.  No audible bruit on examination.  Given the fact that the cerebral infarcts are bilateral and she has symptoms of palpitations recommend a 14-day ambulatory cardiac telemetry to evaluate for possible atrial fibrillation that has been undetected.  Hypercholesterolemia: Patient is referred to the office for management of hyperlipidemia as well; therefore, we will check fasting lipid profile and CMP.  Palpitations: 14-day ambulatory telemetry to rule out underlying atrial fibrillation.  History of TIA with bilateral cerebellar infarcts: Educated on importance of secondary prevention.  Encouraged outpatient follow-up with neurology.  Benign essential hypertension: Patient's office blood pressure within acceptable range.  Medications reconciled.  Currently managed by primary care provider.  FINAL MEDICATION LIST END OF ENCOUNTER: No orders of the defined types were placed in this encounter.   There are no discontinued medications.   Current Outpatient Medications:  .  amLODipine (NORVASC) 2.5 MG tablet, Take 2.5 mg by mouth daily., Disp: , Rfl:  .  atorvastatin (LIPITOR) 40 MG tablet, Take 40 mg by mouth daily., Disp: , Rfl:  .  carvedilol (COREG) 6.25 MG tablet, Take 6.25 mg by mouth 2 (two) times daily., Disp: , Rfl:  .  lisinopril (ZESTRIL) 10 MG tablet, Take 10 mg by mouth daily., Disp: , Rfl:   Orders Placed This Encounter  Procedures  . Lipid Panel With LDL/HDL Ratio  . LDL cholesterol, direct  . CMP14+EGFR  . LONG TERM MONITOR-LIVE TELEMETRY (3-14 DAYS)  . EKG 12-Lead  . PCV ECHOCARDIOGRAM COMPLETE  . PCV CAROTID DUPLEX (BILATERAL)    There are no  Patient Instructions on file for this visit.   --Continue cardiac medications as reconciled in final medication list. --Return in about 6 weeks (around 04/03/2020) for Reevaluation of dizziness and review test results.. Or sooner if needed. --Continue follow-up with your primary care physician regarding the management of your other chronic comorbid conditions.  Patient's questions and concerns were addressed to her satisfaction. She voices understanding of the instructions provided during this encounter.   This note was created using a voice recognition software as a result there may be grammatical errors inadvertently enclosed that do not reflect the nature of this encounter. Every attempt is made to correct such errors.  Georgene Kopper, DO,  [facility]  Pager: [phone] Office: [phone]

## 2020-02-23 ENCOUNTER — Ambulatory Visit: Payer: Medicare Other

## 2020-02-23 ENCOUNTER — Other Ambulatory Visit: Payer: Self-pay

## 2020-02-23 DIAGNOSIS — R42 Dizziness and giddiness: Secondary | ICD-10-CM

## 2020-02-23 DIAGNOSIS — I10 Essential (primary) hypertension: Secondary | ICD-10-CM

## 2020-02-28 NOTE — Progress Notes (Signed)
Relayed information to patient. Patient voiced understanding.  

## 2020-02-28 NOTE — Progress Notes (Signed)
Called patient regarding Carotid artery duplex. AD/S

## 2020-03-25 ENCOUNTER — Ambulatory Visit: Payer: Medicare Other | Admitting: Cardiology

## 2020-04-11 ENCOUNTER — Ambulatory Visit: Payer: Medicare Other | Admitting: Cardiology

## 2020-04-11 ENCOUNTER — Other Ambulatory Visit: Payer: Self-pay

## 2020-04-11 ENCOUNTER — Encounter: Payer: Self-pay | Admitting: Cardiology

## 2020-04-11 VITALS — BP 126/70 | HR 80 | Ht 69.5 in | Wt 197.0 lb

## 2020-04-11 DIAGNOSIS — E7800 Pure hypercholesterolemia, unspecified: Secondary | ICD-10-CM

## 2020-04-11 DIAGNOSIS — R0609 Other forms of dyspnea: Secondary | ICD-10-CM

## 2020-04-11 DIAGNOSIS — R42 Dizziness and giddiness: Secondary | ICD-10-CM

## 2020-04-11 DIAGNOSIS — M7989 Other specified soft tissue disorders: Secondary | ICD-10-CM

## 2020-04-11 DIAGNOSIS — I10 Essential (primary) hypertension: Secondary | ICD-10-CM

## 2020-04-11 DIAGNOSIS — Z8673 Personal history of transient ischemic attack (TIA), and cerebral infarction without residual deficits: Secondary | ICD-10-CM

## 2020-04-11 DIAGNOSIS — R002 Palpitations: Secondary | ICD-10-CM

## 2020-04-11 MED ORDER — ASPIRIN EC 81 MG PO TBEC: 81.0000 mg | DELAYED_RELEASE_TABLET | Freq: Every day | ORAL | 0 refills | Status: AC

## 2020-04-11 MED ORDER — HYDROCHLOROTHIAZIDE 12.5 MG PO CAPS: 12.5000 mg | ORAL_CAPSULE | Freq: Every morning | ORAL | 0 refills | Status: DC

## 2020-04-11 NOTE — Progress Notes (Signed)
ID:  Amber Rich, DOB 19-May-1945, MRN [id number]  PCP:  Trisha Mangle, FNP  Cardiologist:  Tessa Lerner, DO, [facility] (established care 02/21/2020) Former Cardiology Providers: Dr. Rubbie Battiest at [facility] phone # [phone]  Date: 04/11/20 Last Office Visit: 02/21/2020  Chief Complaint  Patient presents with  . Dizziness  . Results  . Follow-up    HPI  Amber Rich is a 75 y.o. female who presents to the office with a chief complaint of "reevaluation of dizziness and review test results." Patient's past medical history and cardiovascular risk factors include: Hx for TIA, hyperlipidemia, hypertension, postmenopausal female, advanced age.  She is referred to the office at the request of Trisha Mangle, * for evaluation of dizziness and hyperlipidemia management.  Patient recently moved from [place] to [place] in February 2021.  She was asked by her primary care provider to establish care with cardiology as that she used to see a cardiologist back in [place].  However, patient failed to do so until recently in August 2021 she started experiencing dizziness and had gone to [facility] 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  Patient continues to be dizzy for the last several months.  The symptoms have been sporadically and last for short period of time.  They occur while she is sitting, ambulating, turning her head side to side.  The dizziness does not happen with changing positions.  She denies any falls or syncopal events.  Patient states that she supposed to follow-up with neurology but has not done so just yet.  Patient strokes were secondary to posterior circulation but given her risk factors was recommended to undergo carotid duplex to evaluate for coronary atherosclerosis.  Carotid duplex noted no significant arterial disease within the ICAs bilaterally.  14-day mobile cardiac ambulatory telemetry also was  performed since last visit which is negative for atrial fibrillation during the monitoring period.   Today patient complains of symptoms of lower extremity swelling and effort related dyspnea.  She denies orthopnea, paroxysmal nocturnal dyspnea.  FUNCTIONAL STATUS: No structured exercise program or daily routine.   ALLERGIES: No Known Allergies  MEDICATION LIST PRIOR TO VISIT: Current Meds  Medication Sig  . aspirin EC 81 MG tablet Take 1 tablet (81 mg total) by mouth daily. Swallow whole.  Marland Kitchen atorvastatin (LIPITOR) 40 MG tablet Take 40 mg by mouth daily.  . carvedilol (COREG) 6.25 MG tablet Take 6.25 mg by mouth 2 (two) times daily.  . hydrochlorothiazide (MICROZIDE) 12.5 MG capsule Take 1 capsule (12.5 mg total) by mouth in the morning.  Marland Kitchen lisinopril (ZESTRIL) 10 MG tablet Take 10 mg by mouth daily.  . [DISCONTINUED] amLODipine (NORVASC) 2.5 MG tablet Take 2.5 mg by mouth daily.     PAST MEDICAL HISTORY: Past Medical History:  Diagnosis Date  . Hyperlipidemia   . Hypertension   . TIA     PAST SURGICAL HISTORY: Past Surgical History:  Procedure Laterality Date  . CESAREAN SECTION    . PARTIAL HYSTERECTOMY    . TONSILLECTOMY    . WISDOM TOOTH EXTRACTION      FAMILY HISTORY: The patient family history includes Brain cancer in her brother; Breast cancer in her mother; Cirrhosis in her father; HIV/AIDS in her sister.  SOCIAL HISTORY:  The patient  reports that she has never smoked. She has never used smokeless tobacco. She reports that she does not drink alcohol and does not use drugs.  REVIEW OF SYSTEMS: Review  of Systems  Constitutional: Negative for chills and fever.  HENT: Negative for hoarse voice and nosebleeds.   Eyes: Negative for discharge, double vision and pain.  Cardiovascular: Positive for leg swelling. Negative for chest pain, claudication, dyspnea on exertion, near-syncope, orthopnea, palpitations, paroxysmal nocturnal dyspnea and syncope.  Respiratory:  Positive for shortness of breath. Negative for hemoptysis.   Musculoskeletal: Negative for muscle cramps and myalgias.  Gastrointestinal: Negative for abdominal pain, constipation, diarrhea, hematemesis, hematochezia, melena, nausea and vomiting.  Neurological: Positive for dizziness (stable) and light-headedness (stable).    PHYSICAL EXAM: Vitals with BMI 04/11/2020 02/21/2020 12/02/2019  Height 5' 9.5" 5' 9.5" -  Weight 197 lbs 200 lbs 11 oz -  BMI [id number] -  Systolic 123XX123 [id number]  Diastolic 70 73 72  Pulse 80 78 66   CONSTITUTIONAL: Well-developed and well-nourished. No acute distress.  SKIN: Skin is warm and dry. No rash noted. No cyanosis. No pallor. No jaundice HEAD: Normocephalic and atraumatic.  EYES: No scleral icterus MOUTH/THROAT: Moist oral membranes.  NECK: No JVD present. No thyromegaly noted. No carotid bruits  LYMPHATIC: No visible cervical adenopathy.  CHEST Normal respiratory effort. No intercostal retractions  LUNGS: Clear to auscultation bilaterally.  No stridor. No wheezes. No rales.  CARDIOVASCULAR: Regular rate and rhythm, positive S1-S2, no murmurs rubs or gallops appreciated. ABDOMINAL: Soft, nontender, nondistended, positive bowel sounds in all 4 quadrants, no apparent ascites.  EXTREMITIES: No peripheral edema  HEMATOLOGIC: No significant bruising NEUROLOGIC: Oriented to person, place, and time. Nonfocal. Normal muscle tone.  PSYCHIATRIC: Normal mood and affect. Normal behavior. Cooperative  CARDIAC DATABASE: EKG: 02/21/2020: Normal sinus rhythm, 73 bpm, poor R wave progression, without underlying injury pattern.  Echocardiogram: 02/23/2020:  Normal LV systolic function with EF 55%. Left ventricle cavity is normal in size. Mild concentric hypertrophy of the left ventricle. Normal global wall motion. Doppler evidence of grade II (pseudonormal) diastolic dysfunction, elevated LAP. Calculated EF 55%. Left atrial cavity is mildly dilated. Structurally  normal mitral valve. Mild (Grade I) mitral regurgitation. Normal mitral valve leaflet mobility. Tricuspid valve is normal. Trace TR precluded estimation of PA pressure. IVC is dilated with respiratory variation. This may suggest elevated central venous pressure.  Stress Testing: None  Heart Catheterization: None  Carotid artery duplex 02/23/2020: No hemodynamically significant arterial disease in the internal carotid artery bilaterally. There is no significant plaque noted.  Antegrade right vertebral artery flow. Antegrade left vertebral artery flow.  14 - day Mobile Cardiac Ambulatory Telemetry.  Dominant rhythm normal sinus rhythm.  Heart rate 47-182 bpm. Avg HR 71 bpm.  No atrial fibrillation, ventricular tachycardia, high grade AV block, pauses (3 seconds or longer).  Total ventricular ectopic burden <1%.  Total supraventricular ectopic burden <1%. Rare episodes of supraventricular tachycardia (fastest run 5 beats in duration at a rate of 182 bpm)  Patient triggered events: 2. The underlying rhythm was normal sinus without dysrhythmias.  RADIOLOGY: 12/02/2019:  CT HEAD IMPRESSION:  1. No acute intracranial abnormality. 2. Few scattered small remote bilateral cerebellar infarcts.  CTA HEAD AND NECK IMPRESSION:  Negative CTA of the head and neck. No large vessel occlusion, hemodynamically significant stenosis or other acute vascular abnormality. No aneurysm.  LABORATORY DATA: CBC Latest Ref Rng & Units 12/02/2019 08/26/2019  WBC 4.0 - 10.5 K/uL - 7.5  Hemoglobin 12.0 - 15.0 g/dL 14.6 13.7  Hematocrit 36.0 - 46.0 % 43.0 42.3  Platelets 150 - 400 K/uL - 178    CMP Latest Ref Rng & Units  12/02/2019 08/26/2019  Glucose 70 - 99 mg/dL 93 106(H)  BUN 8 - 23 mg/dL 23 19  Creatinine 0.44 - 1.00 mg/dL 0.80 0.88  Sodium 135 - 145 mmol/L 141 142  Potassium 3.5 - 5.1 mmol/L 4.0 4.4  Chloride 98 - 111 mmol/L 106 107  CO2 22 - 32 mmol/L - 22  Calcium 8.9 - 10.3 mg/dL - 9.3   Total Protein 6.5 - 8.1 g/dL - 7.1  Total Bilirubin 0.3 - 1.2 mg/dL - 1.0  Alkaline Phos 38 - 126 U/L - 67  AST 15 - 41 U/L - 19  ALT 0 - 44 U/L - 20    Lipid Panel  No results found for: CHOL, TRIG, HDL, CHOLHDL, VLDL, LDLCALC, LDLDIRECT, LABVLDL  No components found for: NTPROBNP No results for input(s): PROBNP in the last 8760 hours. No results for input(s): TSH in the last 8760 hours.  BMP Recent Labs    08/26/19 1544 12/02/19 0032  NA 142 141  K 4.4 4.0  CL 107 106  CO2 22  --   GLUCOSE 106* 93  BUN 19 23  CREATININE 0.88 0.80  CALCIUM 9.3  --   GFRNONAA >60  --   GFRAA >60  --     HEMOGLOBIN A1C No results found for: HGBA1C, MPG  IMPRESSION:    ICD-10-CM   1. Other form of dyspnea  R06.09 Pro b natriuretic peptide (BNP)    PCV MYOCARDIAL PERFUSION WITH LEXISCAN  2. Hx of TIA (transient ischemic attack) and stroke  Z86.73 aspirin EC 81 MG tablet  3. Palpitations  R00.2   4. Hypercholesterolemia  E78.00   5. Benign hypertension  I10 hydrochlorothiazide (MICROZIDE) 12.5 MG capsule  6. Dizziness  R42   7. Leg swelling  M79.89 hydrochlorothiazide (MICROZIDE) 12.5 MG capsule     RECOMMENDATIONS: Amber Rich is a 75 y.o. female whose past medical history and cardiac risk factors include: Hx for TIA, hyperlipidemia, hypertension, postmenopausal female, advanced age.  Dyspnea on exertion:  Echocardiogram notes preserved LVEF with evidence of diastolic dysfunction.  Clinically patient is not volume overloaded.  No JVD on examination denies orthopnea or paroxysmal nocturnal dyspnea.  We will check NT proBNP  Recommend stress test to rule out reversible ischemia given her cardiovascular risk factors as noted above.  Dizziness:  I believe that her underlying dizziness is most likely secondary to her prior cerebellar infarcts noted bilateral on CT scan from August 2021.  Orthostatics were negative in the past.  Carotid duplex negative for disease in the  bilateral ICAs.  14-day ambulatory telemetry negative for atrial fibrillation during the monitoring period.  Recommended the importance of establishing care with neurology given her history and symptoms.  Will defer further management to PCP at this time for other noncardiac causes of her dizziness.  Lower extremity swelling:  Most likely secondary to venous insufficiency or due to amlodipine.  Discontinue amlodipine  Start hydrochlorothiazide 12.5 mg p.o. every morning  Check blood work in 1 week.  To evaluate kidney function and electrolytes.  Hypercholesterolemia: Patient is referred to the office for management of hyperlipidemia as well; therefore, ordered a fasting lipid profile and CMP but she forgot to have them performed.  She is asked to get this done prior to the next office visit.  Further recommendations forthcoming.  History of TIA with bilateral cerebellar infarcts:   Educated on importance of secondary prevention.    Encouraged outpatient follow-up with neurology.  Start aspirin 81 mg p.o. daily.  Lipid profile pending.  Benign essential hypertension: Patient's office blood pressure within acceptable range.  Medications reconciled.  Currently managed by primary care provider.  FINAL MEDICATION LIST END OF ENCOUNTER: Meds ordered this encounter  Medications  . hydrochlorothiazide (MICROZIDE) 12.5 MG capsule    Sig: Take 1 capsule (12.5 mg total) by mouth in the morning.    Dispense:  90 capsule    Refill:  0  . aspirin EC 81 MG tablet    Sig: Take 1 tablet (81 mg total) by mouth daily. Swallow whole.    Dispense:  90 tablet    Refill:  0    Medications Discontinued During This Encounter  Medication Reason  . amLODipine (NORVASC) 2.5 MG tablet Change in therapy     Current Outpatient Medications:  .  aspirin EC 81 MG tablet, Take 1 tablet (81 mg total) by mouth daily. Swallow whole., Disp: 90 tablet, Rfl: 0 .  atorvastatin (LIPITOR) 40 MG tablet, Take 40  mg by mouth daily., Disp: , Rfl:  .  carvedilol (COREG) 6.25 MG tablet, Take 6.25 mg by mouth 2 (two) times daily., Disp: , Rfl:  .  hydrochlorothiazide (MICROZIDE) 12.5 MG capsule, Take 1 capsule (12.5 mg total) by mouth in the morning., Disp: 90 capsule, Rfl: 0 .  lisinopril (ZESTRIL) 10 MG tablet, Take 10 mg by mouth daily., Disp: , Rfl:   Orders Placed This Encounter  Procedures  . Pro b natriuretic peptide (BNP)  . PCV MYOCARDIAL PERFUSION WITH LEXISCAN    There are no Patient Instructions on file for this visit.   --Continue cardiac medications as reconciled in final medication list. --Return in about 3 months (around 07/10/2020) for Follow up, Dyspnea, Review test results. Or sooner if needed. --Continue follow-up with your primary care physician regarding the management of your other chronic comorbid conditions.  Patient's questions and concerns were addressed to her satisfaction. She voices understanding of the instructions provided during this encounter.   This note was created using a voice recognition software as a result there may be grammatical errors inadvertently enclosed that do not reflect the nature of this encounter. Every attempt is made to correct such errors.  Rex Kras, [place], [employer] [facility]  Pager: [phone] Office: [phone]

## 2020-05-06 ENCOUNTER — Other Ambulatory Visit: Payer: Self-pay

## 2020-05-06 DIAGNOSIS — R0609 Other forms of dyspnea: Secondary | ICD-10-CM

## 2020-07-09 NOTE — Progress Notes (Deleted)
ID:  Amber Rich, DOB Oct 21, 1945, MRN [id number]  PCP:  Joya Gaskins, FNP  Cardiologist: Rex Kras, DO, [facility] (established care 02/21/2020) Former Cardiology Providers: Dr. Linward Headland at [facility] phone # [phone]  ***  No chief complaint on file.   HPI  Amber Rich is a 75 y.o. female who presents to the office with a chief complaint of "***." Patient's past medical history and cardiovascular risk factors include: Hx for TIA, hyperlipidemia, hypertension, postmenopausal female, advanced age.  She is referred to the office at the request of Joya Gaskins, * for evaluation of dizziness and hyperlipidemia management.  Patient recently moved from [place] to [place] in February 2021.  She was asked by her primary care provider to establish care with cardiology as that she used to see a cardiologist back in [place].  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  Patient continues to be dizzy for the last several months.  The symptoms have been sporadically and last for short period of time.  They occur while she is sitting, ambulating, turning her head side to side.  The dizziness does not happen with changing positions.  She denies any falls or syncopal events.  Patient states that she supposed to follow-up with neurology but has not done so just yet.  Patient strokes were secondary to posterior circulation but given her risk factors was recommended to undergo carotid duplex to evaluate for coronary atherosclerosis.  Carotid duplex noted no significant arterial disease within the ICAs bilaterally.  14-day mobile cardiac ambulatory telemetry also was performed since last visit which is negative for atrial fibrillation during the monitoring period.   Today patient complains of symptoms of  lower extremity swelling and effort related dyspnea.  She denies orthopnea, paroxysmal nocturnal dyspnea.  FUNCTIONAL STATUS: No structured exercise program or daily routine.   ALLERGIES: No Known Allergies  MEDICATION LIST PRIOR TO VISIT: No outpatient medications have been marked as taking for the 07/10/20 encounter (Appointment) with Terri Skains, [place], DO.     PAST MEDICAL HISTORY: Past Medical History:  Diagnosis Date  . Hyperlipidemia   . Hypertension   . TIA     PAST SURGICAL HISTORY: Past Surgical History:  Procedure Laterality Date  . CESAREAN SECTION    . PARTIAL HYSTERECTOMY    . TONSILLECTOMY    . WISDOM TOOTH EXTRACTION      FAMILY HISTORY: The patient family history includes Brain cancer in her brother; Breast cancer in her mother; Cirrhosis in her father; HIV/AIDS in her sister.  SOCIAL HISTORY:  The patient  reports that she has never smoked. She has never used smokeless tobacco. She reports that she does not drink alcohol and does not use drugs.  REVIEW OF SYSTEMS: Review of Systems  Constitutional: Negative for chills and fever.  HENT: Negative for hoarse voice and nosebleeds.   Eyes: Negative for discharge, double vision and pain.  Cardiovascular: Positive for leg swelling. Negative for chest pain, claudication, dyspnea on exertion, near-syncope, orthopnea, palpitations, paroxysmal nocturnal dyspnea and syncope.  Respiratory: Positive for shortness of breath. Negative for hemoptysis.   Musculoskeletal: Negative for muscle cramps and myalgias.  Gastrointestinal: Negative for abdominal pain, constipation, diarrhea, hematemesis, hematochezia, melena, nausea and vomiting.  Neurological: Positive for dizziness (stable) and light-headedness (stable).    PHYSICAL EXAM: Vitals with BMI 04/11/2020 02/21/2020 12/02/2019  Height 5' 9.5" 5' 9.5" -  Weight 197 lbs 200 lbs 11 oz -  BMI 04.54 09.81 -  Systolic 191 478 295  Diastolic 70 73 72  Pulse 80 78 66    CONSTITUTIONAL: Well-developed and well-nourished. No acute distress.  SKIN: Skin is warm and dry. No rash noted. No cyanosis. No pallor. No jaundice HEAD: Normocephalic and atraumatic.  EYES: No scleral icterus MOUTH/THROAT: Moist oral membranes.  NECK: No JVD present. No thyromegaly noted. No carotid bruits  LYMPHATIC: No visible cervical adenopathy.  CHEST Normal respiratory effort. No intercostal retractions  LUNGS: Clear to auscultation bilaterally.  No stridor. No wheezes. No rales.  CARDIOVASCULAR: Regular rate and rhythm, positive S1-S2, no murmurs rubs or gallops appreciated. ABDOMINAL: Soft, nontender, nondistended, positive bowel sounds in all 4 quadrants, no apparent ascites.  EXTREMITIES: No peripheral edema  HEMATOLOGIC: No significant bruising NEUROLOGIC: Oriented to person, place, and time. Nonfocal. Normal muscle tone.  PSYCHIATRIC: Normal mood and affect. Normal behavior. Cooperative  CARDIAC DATABASE: EKG: 02/21/2020: Normal sinus rhythm, 73 bpm, poor R wave progression, without underlying injury pattern.  Echocardiogram: 02/23/2020:  Normal LV systolic function with EF 55%. Left ventricle cavity is normal in size. Mild concentric hypertrophy of the left ventricle. Normal global wall motion. Doppler evidence of grade II (pseudonormal) diastolic dysfunction, elevated LAP. Calculated EF 55%. Left atrial cavity is mildly dilated. Structurally normal mitral valve. Mild (Grade I) mitral regurgitation. Normal mitral valve leaflet mobility. Tricuspid valve is normal. Trace TR precluded estimation of PA pressure. IVC is dilated with respiratory variation. This may suggest elevated central venous pressure.  Stress Testing: None  Heart Catheterization: None  Carotid artery duplex 02/23/2020: No hemodynamically significant arterial disease in the internal carotid artery bilaterally. There is no significant plaque noted.  Antegrade right vertebral artery flow. Antegrade  left vertebral artery flow.  14 - day Mobile Cardiac Ambulatory Telemetry.  Dominant rhythm normal sinus rhythm.  Heart rate 47-182 bpm. Avg HR 71 bpm.  No atrial fibrillation, ventricular tachycardia, high grade AV block, pauses (3 seconds or longer).  Total ventricular ectopic burden <1%.  Total supraventricular ectopic burden <1%. Rare episodes of supraventricular tachycardia (fastest run 5 beats in duration at a rate of 182 bpm)  Patient triggered events: 2. The underlying rhythm was normal sinus without dysrhythmias.  RADIOLOGY: 12/02/2019:  CT HEAD IMPRESSION:  1. No acute intracranial abnormality. 2. Few scattered small remote bilateral cerebellar infarcts.  CTA HEAD AND NECK IMPRESSION:  Negative CTA of the head and neck. No large vessel occlusion, hemodynamically significant stenosis or other acute vascular abnormality. No aneurysm.  LABORATORY DATA: CBC Latest Ref Rng & Units 12/02/2019 08/26/2019  WBC 4.0 - 10.5 K/uL - 7.5  Hemoglobin 12.0 - 15.0 g/dL 14.6 13.7  Hematocrit 36.0 - 46.0 % 43.0 42.3  Platelets 150 - 400 K/uL - 178    CMP Latest Ref Rng & Units 12/02/2019 08/26/2019  Glucose 70 - 99 mg/dL 93 106(H)  BUN 8 - 23 mg/dL 23 19  Creatinine 0.44 - 1.00 mg/dL 0.80 0.88  Sodium 135 - 145 mmol/L 141 142  Potassium 3.5 - 5.1 mmol/L 4.0 4.4  Chloride 98 - 111 mmol/L 106 107  CO2 22 - 32 mmol/L - 22  Calcium 8.9 - 10.3 mg/dL - 9.3  Total Protein 6.5 - 8.1 g/dL - 7.1  Total Bilirubin 0.3 - 1.2 mg/dL - 1.0  Alkaline Phos 38 - 126 U/L - 67  AST 15 - 41 U/L - 19  ALT  0 - 44 U/L - 20    Lipid Panel  No results found for: CHOL, TRIG, HDL, CHOLHDL, VLDL, LDLCALC, LDLDIRECT, LABVLDL  No components found for: NTPROBNP No results for input(s): PROBNP in the last 8760 hours. No results for input(s): TSH in the last 8760 hours.  BMP Recent Labs    08/26/19 1544 12/02/19 0032  NA 142 141  K 4.4 4.0  CL 107 106  CO2 22  --   GLUCOSE 106* 93  BUN 19 23   CREATININE 0.88 0.80  CALCIUM 9.3  --   GFRNONAA >60  --   GFRAA >60  --     HEMOGLOBIN A1C No results found for: HGBA1C, MPG  IMPRESSION:  No diagnosis found.   RECOMMENDATIONS: Amber Rich is a 75 y.o. female whose past medical history and cardiac risk factors include: Hx for TIA, hyperlipidemia, hypertension, postmenopausal female, advanced age.  Dyspnea on exertion:  Echocardiogram notes preserved LVEF with evidence of diastolic dysfunction.  Clinically patient is not volume overloaded.  No JVD on examination denies orthopnea or paroxysmal nocturnal dyspnea.  We will check NT proBNP  Recommend stress test to rule out reversible ischemia given her cardiovascular risk factors as noted above.  Dizziness:  I believe that her underlying dizziness is most likely secondary to her prior cerebellar infarcts noted bilateral on CT scan from August 2021.  Orthostatics were negative in the past.  Carotid duplex negative for disease in the bilateral ICAs.  14-day ambulatory telemetry negative for atrial fibrillation during the monitoring period.  Recommended the importance of establishing care with neurology given her history and symptoms.  Will defer further management to PCP at this time for other noncardiac causes of her dizziness.  Lower extremity swelling:  Most likely secondary to venous insufficiency or due to amlodipine.  Discontinue amlodipine  Start hydrochlorothiazide 12.5 mg p.o. every morning  Check blood work in 1 week.  To evaluate kidney function and electrolytes.  Hypercholesterolemia: Patient is referred to the office for management of hyperlipidemia as well; therefore, ordered a fasting lipid profile and CMP but she forgot to have them performed.  She is asked to get this done prior to the next office visit.  Further recommendations forthcoming.  History of TIA with bilateral cerebellar infarcts:   Educated on importance of secondary prevention.     Encouraged outpatient follow-up with neurology.  Start aspirin 81 mg p.o. daily.  Lipid profile pending.  Benign essential hypertension: Patient's office blood pressure within acceptable range.  Medications reconciled.  Currently managed by primary care provider.  FINAL MEDICATION LIST END OF ENCOUNTER: No orders of the defined types were placed in this encounter.   There are no discontinued medications.   Current Outpatient Medications:  .  aspirin EC 81 MG tablet, Take 1 tablet (81 mg total) by mouth daily. Swallow whole., Disp: 90 tablet, Rfl: 0 .  atorvastatin (LIPITOR) 40 MG tablet, Take 40 mg by mouth daily., Disp: , Rfl:  .  carvedilol (COREG) 6.25 MG tablet, Take 6.25 mg by mouth 2 (two) times daily., Disp: , Rfl:  .  hydrochlorothiazide (MICROZIDE) 12.5 MG capsule, Take 1 capsule (12.5 mg total) by mouth in the morning., Disp: 90 capsule, Rfl: 0 .  lisinopril (ZESTRIL) 10 MG tablet, Take 10 mg by mouth daily., Disp: , Rfl:   No orders of the defined types were placed in this encounter.   There are no Patient Instructions on file for this visit.   --Continue cardiac medications  as reconciled in final medication list. --No follow-ups on file. Or sooner if needed. --Continue follow-up with your primary care physician regarding the management of your other chronic comorbid conditions.  Patient's questions and concerns were addressed to her satisfaction. She voices understanding of the instructions provided during this encounter.   This note was created using a voice recognition software as a result there may be grammatical errors inadvertently enclosed that do not reflect the nature of this encounter. Every attempt is made to correct such errors.  Rex Kras, [place], [facility]  Pager: [phone] Office: [phone]

## 2020-07-10 ENCOUNTER — Ambulatory Visit: Payer: Medicare Other | Admitting: Cardiology

## 2020-07-10 DIAGNOSIS — Z8673 Personal history of transient ischemic attack (TIA), and cerebral infarction without residual deficits: Secondary | ICD-10-CM

## 2020-07-10 DIAGNOSIS — E7800 Pure hypercholesterolemia, unspecified: Secondary | ICD-10-CM

## 2020-07-10 DIAGNOSIS — R0609 Other forms of dyspnea: Secondary | ICD-10-CM

## 2020-07-10 DIAGNOSIS — M7989 Other specified soft tissue disorders: Secondary | ICD-10-CM

## 2020-07-10 DIAGNOSIS — R42 Dizziness and giddiness: Secondary | ICD-10-CM

## 2020-07-10 DIAGNOSIS — R002 Palpitations: Secondary | ICD-10-CM

## 2020-07-10 DIAGNOSIS — I10 Essential (primary) hypertension: Secondary | ICD-10-CM

## 2020-09-13 ENCOUNTER — Ambulatory Visit: Payer: Medicare Other | Admitting: Cardiology

## 2020-09-13 ENCOUNTER — Other Ambulatory Visit: Payer: Self-pay

## 2020-09-13 ENCOUNTER — Encounter: Payer: Self-pay | Admitting: Cardiology

## 2020-09-13 VITALS — BP 141/76 | HR 85 | Temp 98.0°F | Resp 17 | Ht 69.5 in | Wt 188.0 lb

## 2020-09-13 DIAGNOSIS — Z8673 Personal history of transient ischemic attack (TIA), and cerebral infarction without residual deficits: Secondary | ICD-10-CM

## 2020-09-13 DIAGNOSIS — R42 Dizziness and giddiness: Secondary | ICD-10-CM

## 2020-09-13 DIAGNOSIS — E7800 Pure hypercholesterolemia, unspecified: Secondary | ICD-10-CM

## 2020-09-13 DIAGNOSIS — I951 Orthostatic hypotension: Secondary | ICD-10-CM

## 2020-09-13 DIAGNOSIS — I10 Essential (primary) hypertension: Secondary | ICD-10-CM

## 2020-09-13 NOTE — Progress Notes (Signed)
ID:  Amber Rich, DOB 1945/10/26, MRN [id number]  PCP:  Joya Gaskins, FNP  Cardiologist:  Rex Kras, DO, [facility] (established care 02/21/2020) Former Cardiology Providers: Dr. Linward Headland at [facility] phone # [phone]  Date: 09/13/20 Last Office Visit: 04/11/2020   Chief Complaint  Patient presents with   Dizziness    HPI  Amber Rich is a 75 y.o. female who presents to the office with a chief complaint of "dizziness." Patient's past medical history and cardiovascular risk factors include: Hx for CVA, hyperlipidemia, hypertension, postmenopausal female, advanced age.  She is referred to the office at the request of Joya Gaskins, * for evaluation of dizziness and hyperlipidemia management.  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  Since that she continued to have residual dizziness while sitting, ambulating, or turning her head side to side.  Since establishing care she has undergone a 14-day extended Holter monitor which did not illustrate atrial fibrillation during the monitoring period.  She now presents for 65-monthfollow-up for reevaluation of dizziness.   Since last several weeks patient states that the dizziness has been getting progressively worse.   Predominantly present with ambulation and prolonged standing.  She thinks that she may have had a syncopal event approximately 3 days ago.   Patient states that she was in bed and does not recall when she went to the bathroom but later was found by her son on the bathroom floor.  She does not recall how she ended up on the bathroom floor or if she lost consciousness.  Due to continued dizziness and headache [facility] She has an upcoming appointment with neurology in July 2022 and now is  here for cardiovascular evaluation.  Patient denies any chest pain or shortness of breath at rest or with effort related activities.  FUNCTIONAL STATUS: No structured exercise program or daily routine.   ALLERGIES: No Known Allergies  MEDICATION LIST PRIOR TO VISIT: Current Meds  Medication Sig   atorvastatin (LIPITOR) 40 MG tablet Take 40 mg by mouth daily.   carvedilol (COREG) 6.25 MG tablet Take 6.25 mg by mouth 2 (two) times daily.   DULoxetine (CYMBALTA) 30 MG capsule Take 1 capsule by mouth daily.   lisinopril (ZESTRIL) 10 MG tablet Take 10 mg by mouth daily.     PAST MEDICAL HISTORY: Past Medical History:  Diagnosis Date   History of cerebellar infarcts.    Hyperlipidemia    Hypertension     PAST SURGICAL HISTORY: Past Surgical History:  Procedure Laterality Date   CESAREAN SECTION     PARTIAL HYSTERECTOMY     TONSILLECTOMY     WISDOM TOOTH EXTRACTION      FAMILY HISTORY: The patient family history includes Brain cancer in her brother; Breast cancer in her mother; Cirrhosis in her father; HIV/AIDS in her sister.  SOCIAL HISTORY:  The patient  reports that she has never smoked. She has never used smokeless tobacco. She reports that she does not drink alcohol and does not use drugs.  REVIEW OF SYSTEMS: Review of Systems  Constitutional: Negative for chills and fever.  HENT:  Negative for hoarse voice and nosebleeds.   Eyes:  Negative for discharge, double vision and pain.  Cardiovascular:  Positive for syncope. Negative for chest pain, claudication, dyspnea on exertion,  leg swelling, near-syncope, orthopnea, palpitations and paroxysmal nocturnal dyspnea.  Respiratory:  Negative for hemoptysis and shortness of breath.   Musculoskeletal:  Negative for muscle cramps and myalgias.  Gastrointestinal:  Negative for abdominal pain, constipation, diarrhea, hematemesis, hematochezia, melena, nausea and vomiting.  Neurological:  Positive for dizziness and  light-headedness.   PHYSICAL EXAM: Vitals with BMI 09/13/2020 04/11/2020 02/21/2020  Height 5' 9.5" 5' 9.5" 5' 9.5"  Weight 188 lbs 197 lbs 200 lbs 11 oz  BMI 27.37 85.02 77.41  Systolic 287 867 672  Diastolic 76 70 73  Pulse 85 80 78   Orthostatic VS for the past 72 hrs (Last 3 readings):  Orthostatic BP Patient Position BP Location Cuff Size Orthostatic Pulse  09/13/20 1110 109/65 Standing Left Arm Large 84  09/13/20 1109 137/80 Sitting Left Arm Large 82  09/13/20 1108 145/77 Supine Left Arm Large 74    CONSTITUTIONAL: Well-developed and well-nourished. No acute distress.  SKIN: Skin is warm and dry. No rash noted. No cyanosis. No pallor. No jaundice HEAD: Normocephalic and atraumatic.  EYES: No scleral icterus MOUTH/THROAT: Moist oral membranes.  NECK: No JVD present. No thyromegaly noted. No carotid bruits  LYMPHATIC: No visible cervical adenopathy.  CHEST Normal respiratory effort. No intercostal retractions  LUNGS: Clear to auscultation bilaterally.  No stridor. No wheezes. No rales.  CARDIOVASCULAR: Regular rate and rhythm, positive S1-S2, no murmurs rubs or gallops appreciated. ABDOMINAL: Soft, nontender, nondistended, positive bowel sounds in all 4 quadrants, no apparent ascites.  EXTREMITIES: No peripheral edema  HEMATOLOGIC: No significant bruising NEUROLOGIC: Oriented to person, place, and time. Nonfocal. Normal muscle tone.  PSYCHIATRIC: Normal mood and affect. Normal behavior. Cooperative  RADIOLOGY:  CT HEAD W/O CONTRAST:  09/06/2020 Age-indeterminate infarcts within the right caudate nucleus and favor remote infarcts within the bilateral cerebellar hemispheres. Otherwise, no findings to suggest acute large territory infarct by CT, but consider MRI for furtherevaluation if there is concern for an acute infarct.  CARDIAC DATABASE: EKG: 09/13/2020: Normal sinus rhythm, 80 bpm, without underlying ischemia or injury pattern.    Echocardiogram: 02/23/2020:  Normal  LV systolic function with EF 55%. Left ventricle cavity is normal in size. Mild concentric hypertrophy of the left ventricle. Normal global wall motion. Doppler evidence of grade II (pseudonormal) diastolic dysfunction, elevated LAP. Calculated EF 55%. Left atrial cavity is mildly dilated. Structurally normal mitral valve. Mild (Grade I) mitral regurgitation. Normal mitral valve leaflet mobility. Tricuspid valve is normal. Trace TR precluded estimation of PA pressure. IVC is dilated with respiratory variation. This may suggest elevated central venous pressure.  Stress Testing: None  Heart Catheterization: None  Carotid artery duplex 02/23/2020: No hemodynamically significant arterial disease in the internal carotid artery bilaterally. There is no significant plaque noted.  Antegrade right vertebral artery flow. Antegrade left vertebral artery flow.  14 - day Mobile Cardiac Ambulatory Telemetry.  Dominant rhythm normal sinus rhythm.  Heart rate 47-182 bpm.  Avg HR 71 bpm.  No atrial fibrillation, ventricular tachycardia, high grade AV block, pauses (3 seconds or longer).  Total ventricular ectopic burden <1%.  Total supraventricular ectopic burden <1%.  Rare episodes of supraventricular tachycardia (fastest run 5 beats in duration at a rate of 182 bpm)  Patient triggered events: 2.  The underlying rhythm was normal sinus without dysrhythmias.  LABORATORY DATA: CBC Latest Ref Rng & Units 12/02/2019 08/26/2019  WBC 4.0 - 10.5 K/uL - 7.5  Hemoglobin 12.0 - 15.0 g/dL 14.6 13.7  Hematocrit 36.0 - 46.0 % 43.0 42.3  Platelets 150 - 400 K/uL - 178    CMP Latest Ref Rng & Units 12/02/2019 08/26/2019  Glucose 70 - 99 mg/dL 93 106(H)  BUN 8 - 23 mg/dL 23 19  Creatinine 0.44 - 1.00 mg/dL 0.80 0.88  Sodium 135 - 145 mmol/L 141 142  Potassium 3.5 - 5.1 mmol/L 4.0 4.4  Chloride 98 - 111 mmol/L 106 107  CO2 22 - 32 mmol/L - 22  Calcium 8.9 - 10.3 mg/dL - 9.3  Total Protein 6.5 - 8.1 g/dL - 7.1   Total Bilirubin 0.3 - 1.2 mg/dL - 1.0  Alkaline Phos 38 - 126 U/L - 67  AST 15 - 41 U/L - 19  ALT 0 - 44 U/L - 20    Lipid Panel  No results found for: CHOL, TRIG, HDL, CHOLHDL, VLDL, LDLCALC, LDLDIRECT, LABVLDL  No components found for: NTPROBNP No results for input(s): PROBNP in the last 8760 hours. No results for input(s): TSH in the last 8760 hours.  BMP Recent Labs    12/02/19 0032  NA 141  K 4.0  CL 106  GLUCOSE 93  BUN 23  CREATININE 0.80    HEMOGLOBIN A1C No results found for: HGBA1C, MPG  External Labs: Collected: 08/30/2020 at [facility] Total cholesterol 113, triglycerides 120, HDL 49, LDL 44, non-HDL 64  External Labs: Collected: 09/06/2020 at [facility] Sodium 138, potassium 4.4, chloride 103, bicarb 25, BUN 36, creatinine 1.39 eGFR 40 Hemoglobin A1c 5.7   IMPRESSION:    ICD-10-CM   1. Dizziness  R42     2. Orthostatic hypotension  I95.1     3. Hx of TIA (transient ischemic attack) and stroke  Z86.73     4. Hypercholesterolemia  E78.00     5. Benign hypertension  I10 EKG 12-Lead       RECOMMENDATIONS: Dilia Alemany is a 75 y.o. female whose past medical history and cardiac risk factors include: Hx for TIA and stroke, hyperlipidemia, hypertension, postmenopausal female, advanced age.  Dizziness: Multifactorial History of bilateral cerebellar infarcts noted on CT scan from August 2021. Orthostatic vital signs positive during today's office visit. Recommended increased fluid intake, may increase salt slowly to see if symptoms improve, compression stockings recommended. He is asked to change positions slowly Fall precautions discussed Follow-up with neurology given her strokes involving the cerebellar region as well as the caudate nucleus. Medications reconciled -agree with holding HCTZ which was initially given to the patient for lower extremity swelling.  Also opioid vasodilator  therapy.  History of strokes/and a TIA: CT from August 2021 noted bilateral cerebellar infarcts CT from June 2022 at [facility] notes "Age-indeterminate infarcts within the right caudate nucleus and favor remote infarcts within the bilateral cerebellar hemispheres." Patient has had a 14-day mobile cardiac ambulatory telemetry. Given her history of TIA, CT findings of bilateral cerebellar infarcts as well as right caudate nucleus, and episodes of near syncope/syncope discussed loop implantation to rule out the presence of atrial fibrillation.  Patient would like to consider this but will call the office when she is ready.  I have asked her to discuss this further with neurology as well. Continue aspirin and statin therapy. Carotid duplex performed recently-results noted above  Benign essential hypertension: Blood pressure within acceptable range. Medications reconciled. Currently managed by primary care provider. Given her orthostatic vital signs no titration of her blood pressure medications recommended at this time.  Will defer to PCP for now.  Hypercholesterolemia:  Continue statin therapy. Most recent lipid profile from [facility] reviewed dated 08/30/2020.    FINAL MEDICATION LIST END OF ENCOUNTER: No orders of the defined types were placed in this encounter.   Medications Discontinued During This Encounter  Medication Reason   hydrochlorothiazide (MICROZIDE) 12.5 MG capsule Error     Current Outpatient Medications:    atorvastatin (LIPITOR) 40 MG tablet, Take 40 mg by mouth daily., Disp: , Rfl:    carvedilol (COREG) 6.25 MG tablet, Take 6.25 mg by mouth 2 (two) times daily., Disp: , Rfl:    DULoxetine (CYMBALTA) 30 MG capsule, Take 1 capsule by mouth daily., Disp: , Rfl:    lisinopril (ZESTRIL) 10 MG tablet, Take 10 mg by mouth daily., Disp: , Rfl:   Orders Placed This Encounter  Procedures   EKG 12-Lead    Patient  Instructions  Discussed with family members and neurology with regards to loop recorder implantation given episodes of passing out and history of strokes.  Please use compression stockings.  Keep her self well-hydrated, eat 3 balanced meals, and increase salt in the diet.   --Continue cardiac medications as reconciled in final medication list. --Return in about 4 weeks (around 10/11/2020) for Follow up orthostatic hypotension, questionable syncope. Or sooner if needed. --Continue follow-up with your primary care physician regarding the management of your other chronic comorbid conditions.  Patient's questions and concerns were addressed to her satisfaction. She voices understanding of the instructions provided during this encounter.   This note was created using a voice recognition software as a result there may be grammatical errors inadvertently enclosed that do not reflect the nature of this encounter. Every attempt is made to correct such errors.  Rex Kras, [place], [facility]  Pager: [phone] Office: [phone]

## 2020-09-13 NOTE — Patient Instructions (Signed)
Discussed with family members and neurology with regards to loop recorder implantation given episodes of passing out and history of strokes.  Please use compression stockings.  Keep her self well-hydrated, eat 3 balanced meals, and increase salt in the diet.

## 2021-06-21 IMAGING — CT CT HEAD W/O CM
4 series · 16 of 47 positions shown, 18 images · non-contrast
Comparison: None.

CLINICAL DATA: Dizziness.

EXAM:
CT HEAD WITHOUT CONTRAST
TECHNIQUE: Contiguous axial images were obtained from the base of the skull
through the vertex without intravenous contrast.

[Series 3: head without · axial · non-contrast · 0.40mm/px · z∈[-113,+2]mm · 7 of 31 slices shown, 9 images]
[im 4/31  brain]
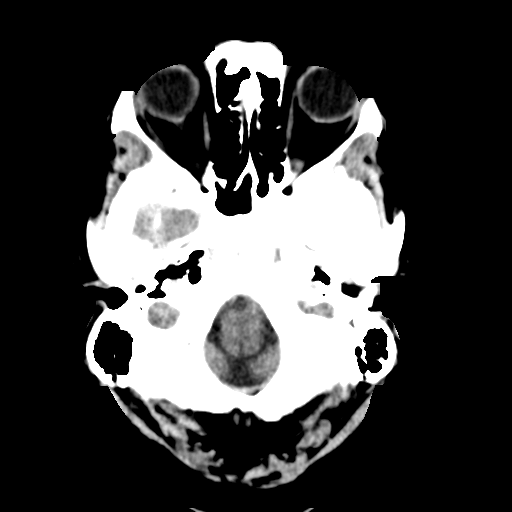
[im 4/31  bone]
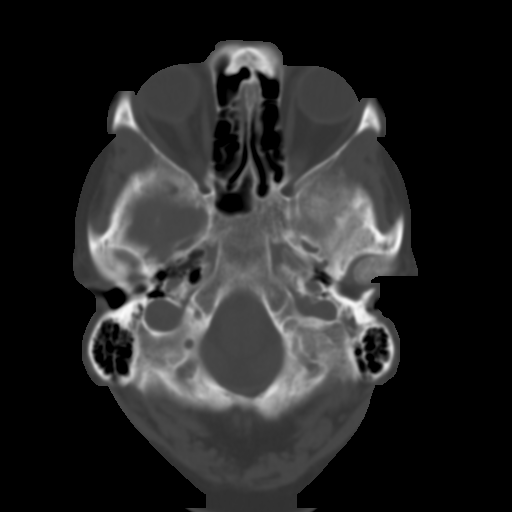
[im 8/31  brain]
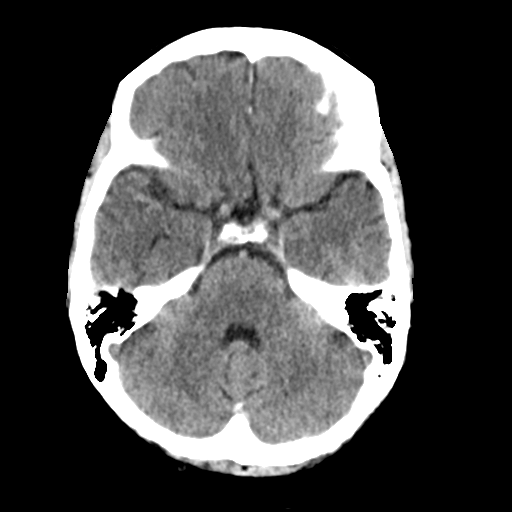
[im 12/31  brain]
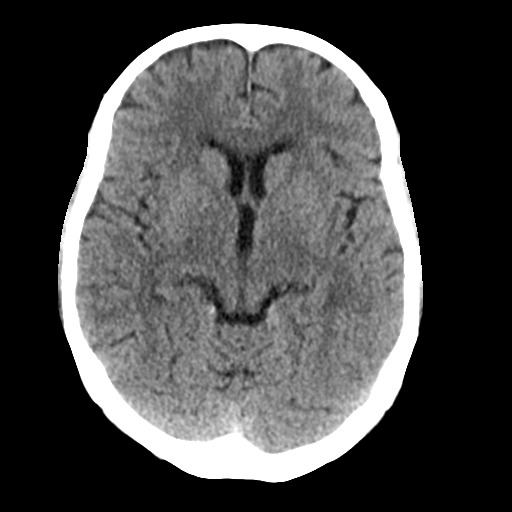
[im 16/31  brain]
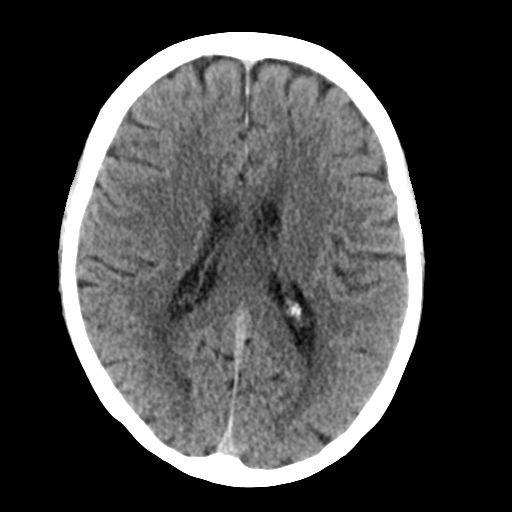
[im 19/31  brain]
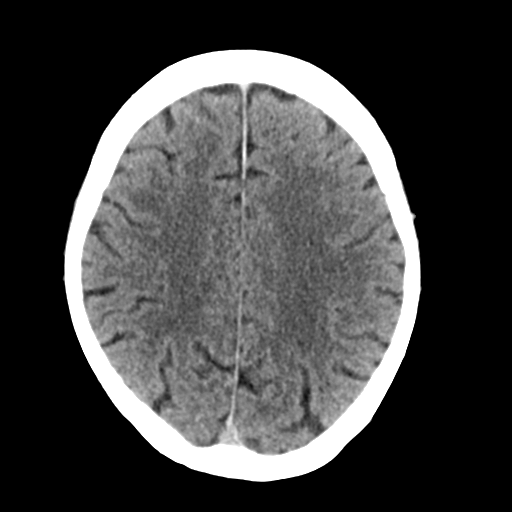
[im 19/31  bone]
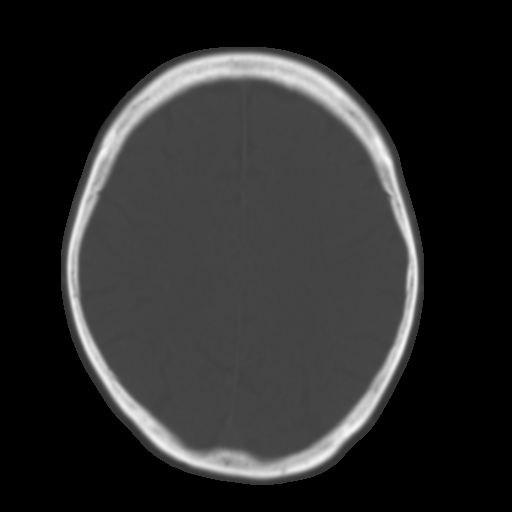
[im 23/31  brain]
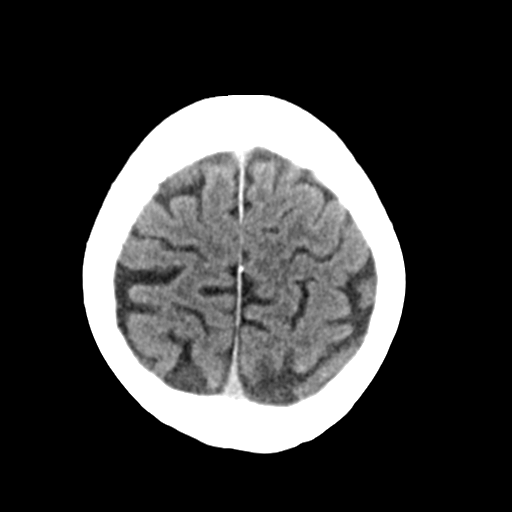
[im 27/31  brain]
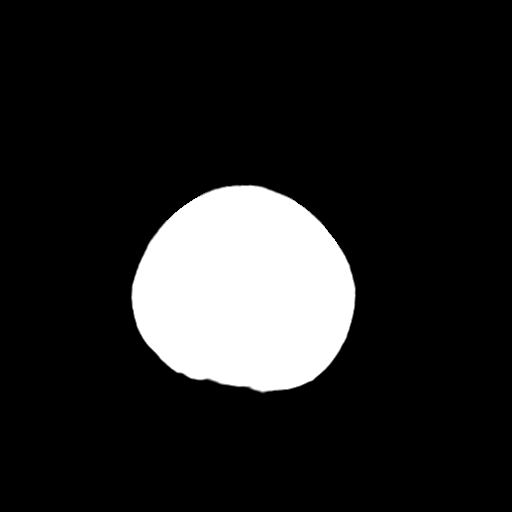

[Series 4: head bone · axial · 0.40mm/px · z∈[-114,-84]mm · 3 of 77 slices shown]
[im 8/77  bone]
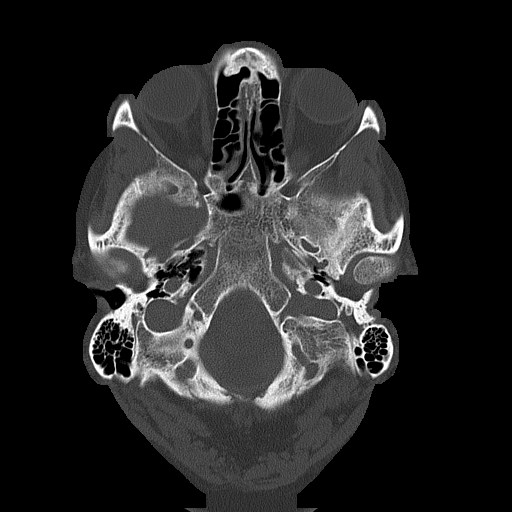
[im 16/77  bone]
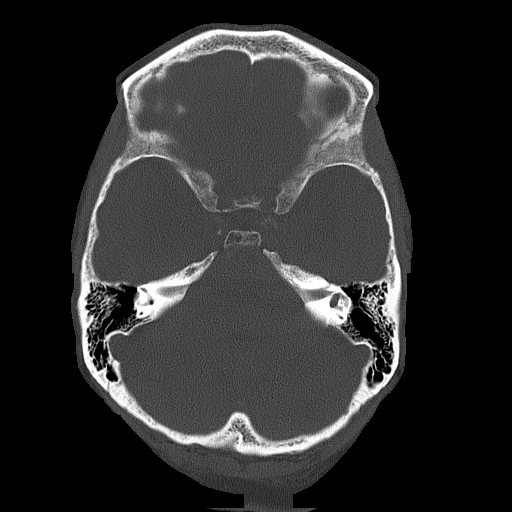
[im 23/77  bone]
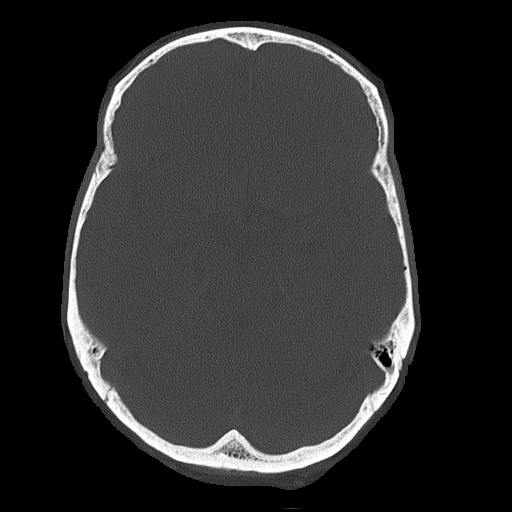

[Series 5: head without cor · coronal · non-contrast · 0.29mm/px · 3 of 67 slices shown]
[im 23/67  brain]
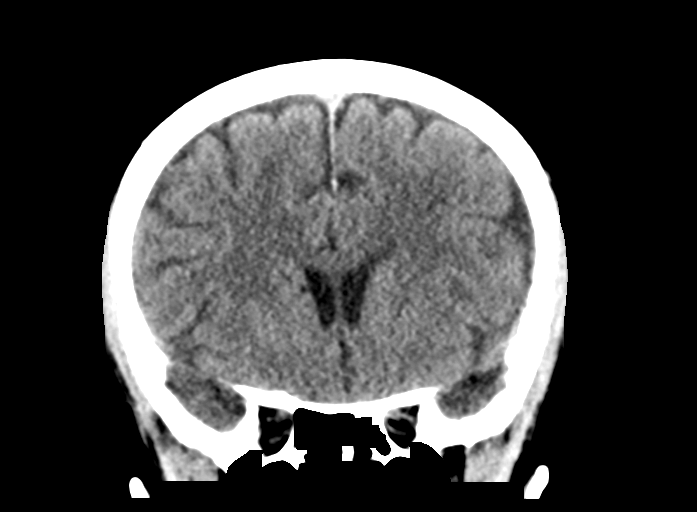
[im 30/67  brain]
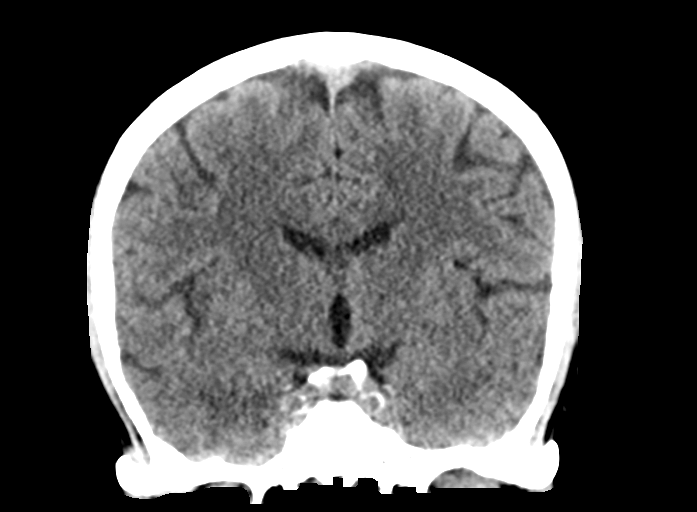
[im 37/67  brain]
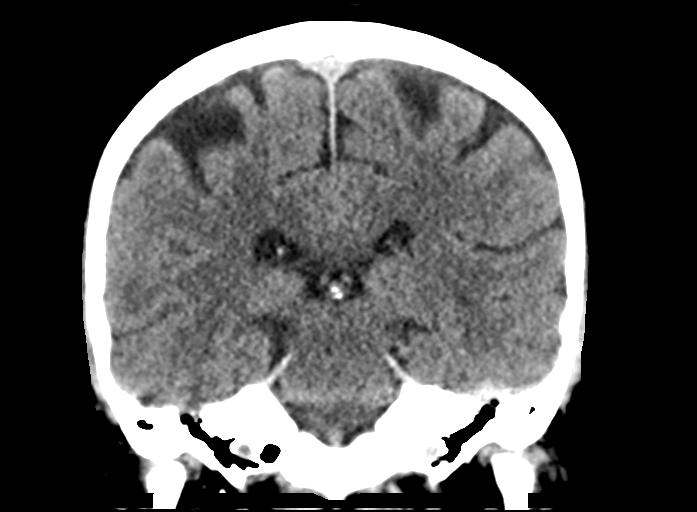

[Series 6: head without sag · sagittal · non-contrast · 0.30mm/px · 3 of 54 slices shown]
[im 18/54  brain]
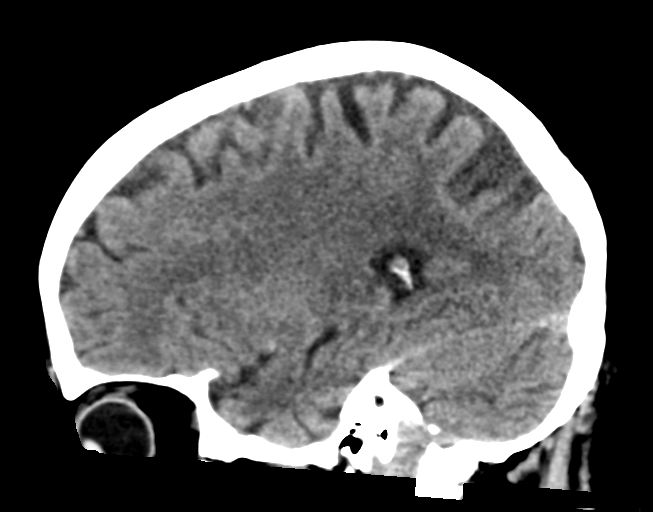
[im 27/54  brain]
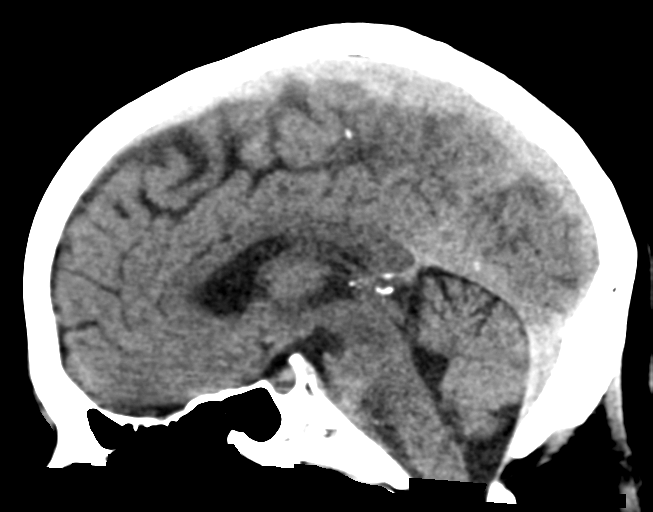
[im 36/54  brain]
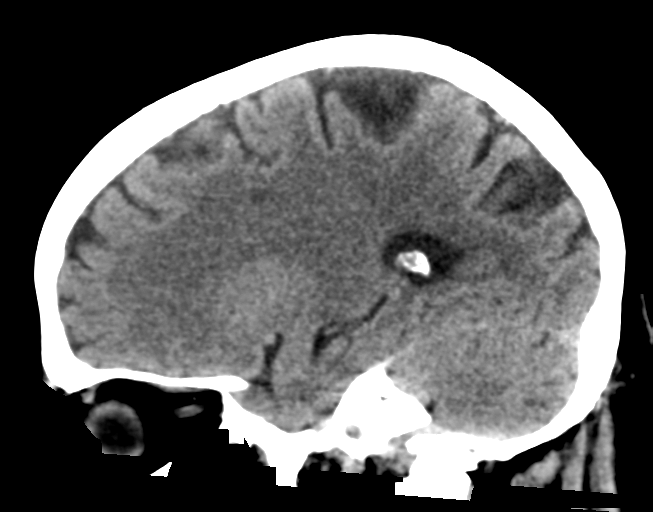

[16 of 47 positions shown; findings below may reference images not displayed]

FINDINGS: Brain: No evidence of acute infarction, hemorrhage, hydrocephalus,
extra-axial collection or mass lesion/mass effect.

Vascular: No hyperdense vessel or unexpected calcification.

Skull: Normal. Negative for fracture or focal lesion.

Sinuses/Orbits: No acute finding.

Other: None.
IMPRESSION: No acute intracranial pathology.

## 2021-06-21 IMAGING — DX DG CHEST 2V
3 series · 3 of 3 positions shown · non-contrast
Comparison: None.

CLINICAL DATA: Right-sided rib pain.

EXAM:
CHEST - 2 VIEW

[chest lat (1 of 2)]
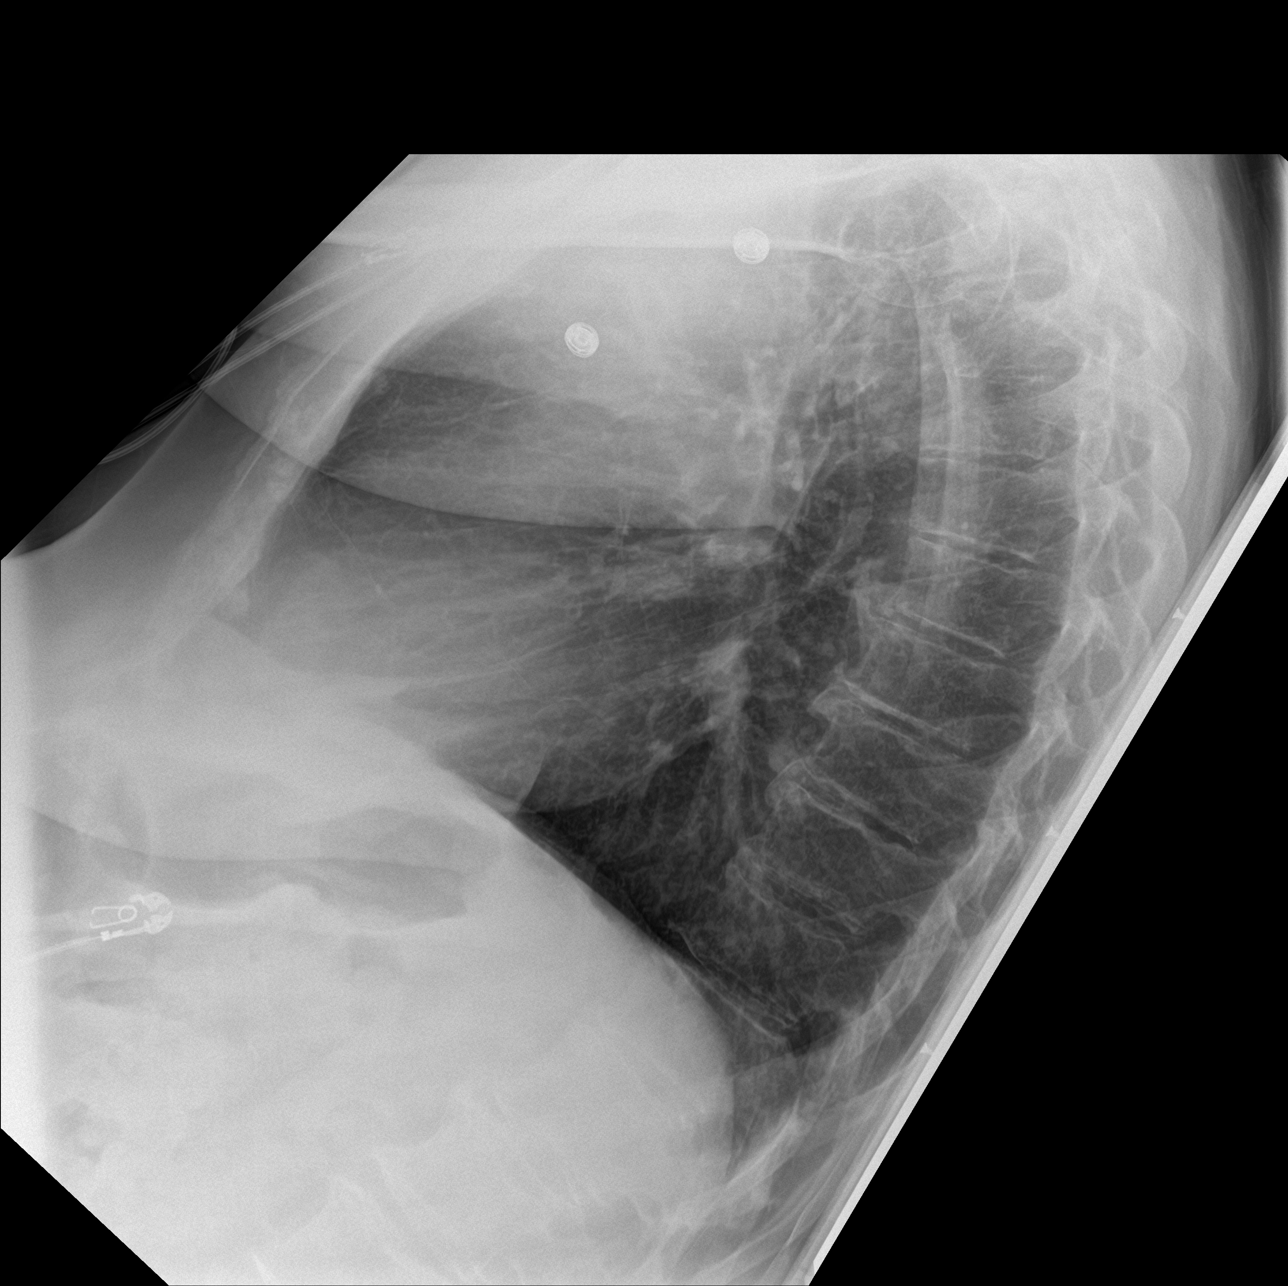

[chest ap]
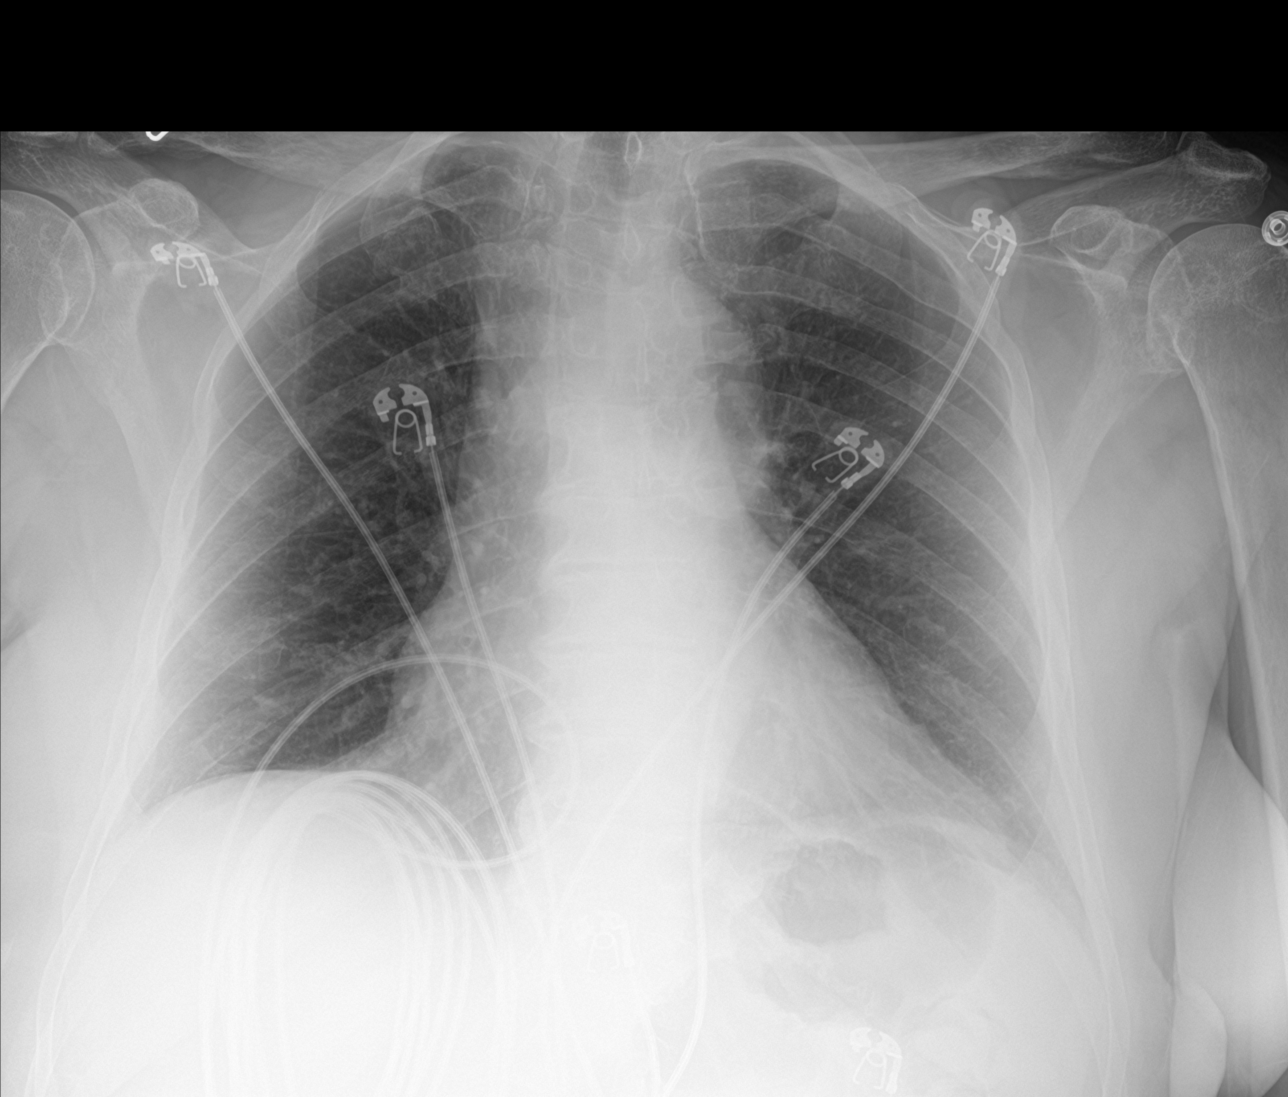

[chest lat (2 of 2)]
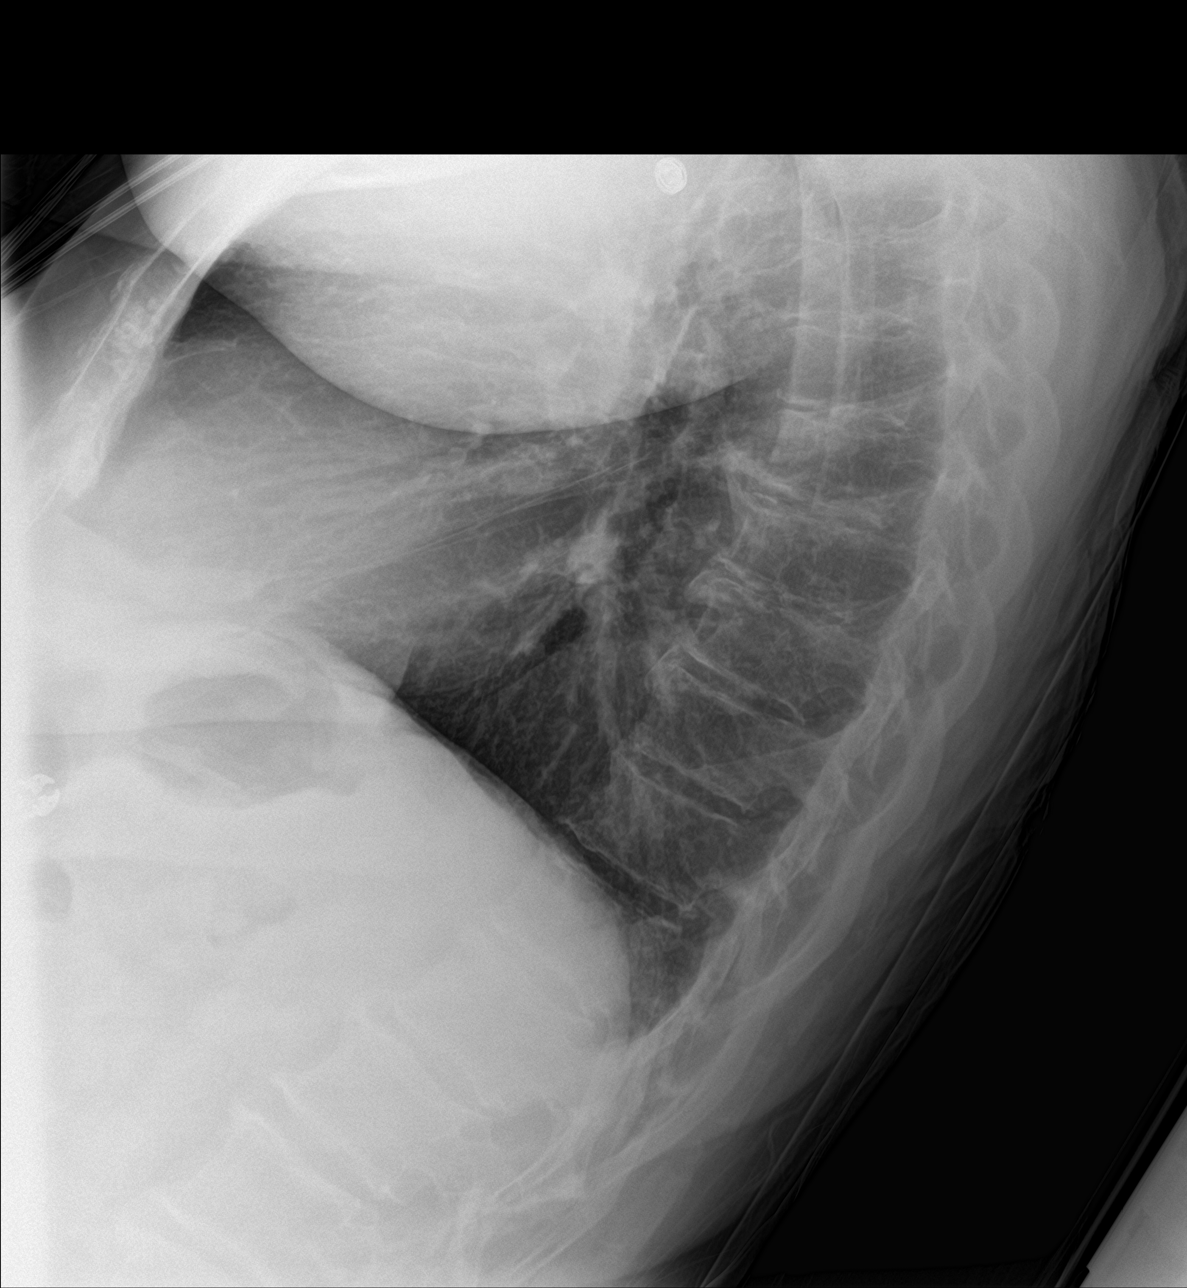

[3 of 3 positions shown; findings below may reference images not displayed]

FINDINGS: The heart size and mediastinal contours are within normal limits.
Both lungs are clear. The visualized skeletal structures are
unremarkable.
IMPRESSION: No active cardiopulmonary disease.
